# Patient Record
Sex: Female | Born: 1940 | Race: White | Hispanic: No | Marital: Married | Smoking: Former smoker
Health system: Southern US, Community
[De-identification: ages and names within clinical notes are randomized; demographics above are authoritative.]

## PROBLEM LIST (undated history)

## (undated) DIAGNOSIS — I10 Essential (primary) hypertension: Secondary | ICD-10-CM

## (undated) DIAGNOSIS — I350 Nonrheumatic aortic (valve) stenosis: Secondary | ICD-10-CM

## (undated) DIAGNOSIS — T8859XA Other complications of anesthesia, initial encounter: Secondary | ICD-10-CM

## (undated) DIAGNOSIS — J3089 Other allergic rhinitis: Secondary | ICD-10-CM

## (undated) DIAGNOSIS — H9190 Unspecified hearing loss, unspecified ear: Secondary | ICD-10-CM

## (undated) DIAGNOSIS — R112 Nausea with vomiting, unspecified: Secondary | ICD-10-CM

## (undated) DIAGNOSIS — Z9889 Other specified postprocedural states: Secondary | ICD-10-CM

## (undated) DIAGNOSIS — E7800 Pure hypercholesterolemia, unspecified: Secondary | ICD-10-CM

## (undated) DIAGNOSIS — K5900 Constipation, unspecified: Secondary | ICD-10-CM

## (undated) DIAGNOSIS — S92909A Unspecified fracture of unspecified foot, initial encounter for closed fracture: Secondary | ICD-10-CM

## (undated) DIAGNOSIS — G629 Polyneuropathy, unspecified: Secondary | ICD-10-CM

## (undated) DIAGNOSIS — K589 Irritable bowel syndrome without diarrhea: Secondary | ICD-10-CM

## (undated) DIAGNOSIS — T4145XA Adverse effect of unspecified anesthetic, initial encounter: Secondary | ICD-10-CM

## (undated) DIAGNOSIS — R011 Cardiac murmur, unspecified: Secondary | ICD-10-CM

## (undated) HISTORY — PX: TONSILLECTOMY: SUR1361

## (undated) HISTORY — PX: ABDOMINAL HYSTERECTOMY: SHX81

## (undated) HISTORY — PX: CHOLECYSTECTOMY: SHX55

---

## 2012-09-09 ENCOUNTER — Emergency Department: Payer: Self-pay | Admitting: Emergency Medicine

## 2012-09-09 LAB — COMPREHENSIVE METABOLIC PANEL
Albumin: 3.9 g/dL (ref 3.4–5.0)
Alkaline Phosphatase: 128 U/L (ref 50–136)
Anion Gap: 7 (ref 7–16)
Bilirubin,Total: 0.5 mg/dL (ref 0.2–1.0)
Calcium, Total: 9 mg/dL (ref 8.5–10.1)
Chloride: 104 mmol/L (ref 98–107)
Creatinine: 0.58 mg/dL — ABNORMAL LOW (ref 0.60–1.30)
EGFR (African American): >60
EGFR (Non-African Amer.): >60
Osmolality: 278 (ref 275–301)
Potassium: 4.6 mmol/L (ref 3.5–5.1)
SGOT(AST): 25 U/L (ref 15–37)
Sodium: 138 mmol/L (ref 136–145)

## 2012-09-09 LAB — URINALYSIS, COMPLETE
Bacteria: NONE SEEN
Blood: NEGATIVE
Glucose,UR: NEGATIVE mg/dL (ref 0–75)
Ketone: NEGATIVE
Nitrite: NEGATIVE
Ph: 8 (ref 4.5–8.0)
Protein: 100
Specific Gravity: 1.016 (ref 1.003–1.030)
Squamous Epithelial: 3
WBC UR: 12 /HPF (ref 0–5)

## 2012-09-09 LAB — CBC
HCT: 41.6 % (ref 35.0–47.0)
HGB: 14.6 g/dL (ref 12.0–16.0)
MCH: 32.2 pg (ref 26.0–34.0)
Platelet: 256 10*3/uL (ref 150–440)
RDW: 12.5 % (ref 11.5–14.5)

## 2012-09-09 LAB — LIPASE, BLOOD: Lipase: 137 U/L (ref 73–393)

## 2013-02-13 ENCOUNTER — Emergency Department: Payer: Self-pay | Admitting: Emergency Medicine

## 2013-02-13 LAB — CBC
HCT: 40.8 % (ref 35.0–47.0)
HGB: 14.1 g/dL (ref 12.0–16.0)
Platelet: 286 10*3/uL (ref 150–440)
RBC: 4.43 10*6/uL (ref 3.80–5.20)
RDW: 12.5 % (ref 11.5–14.5)

## 2013-02-14 LAB — URINALYSIS, COMPLETE
Bilirubin,UR: NEGATIVE
Blood: NEGATIVE
Ketone: NEGATIVE
Leukocyte Esterase: NEGATIVE
RBC,UR: NONE SEEN /HPF (ref 0–5)
Specific Gravity: 1.006 (ref 1.003–1.030)

## 2013-02-14 LAB — COMPREHENSIVE METABOLIC PANEL
Albumin: 3.9 g/dL (ref 3.4–5.0)
Alkaline Phosphatase: 131 U/L — ABNORMAL HIGH
Anion Gap: 7 (ref 7–16)
BUN: 15 mg/dL (ref 7–18)
Bilirubin,Total: 0.3 mg/dL (ref 0.2–1.0)
Chloride: 103 mmol/L (ref 98–107)
Creatinine: 0.81 mg/dL (ref 0.60–1.30)
Glucose: 110 mg/dL — ABNORMAL HIGH (ref 65–99)
Osmolality: 273 (ref 275–301)
Potassium: 3.7 mmol/L (ref 3.5–5.1)
SGOT(AST): 27 U/L (ref 15–37)
SGPT (ALT): 27 U/L (ref 12–78)
Sodium: 136 mmol/L (ref 136–145)
Total Protein: 7.5 g/dL (ref 6.4–8.2)

## 2013-02-14 LAB — TROPONIN I: Troponin-I: <0.02 ng/mL

## 2013-02-14 LAB — CK TOTAL AND CKMB (NOT AT ARMC): CK, Total: 65 U/L (ref 21–215)

## 2014-12-25 IMAGING — CR DG CHEST 2V
1 series · 2 of 2 positions shown · non-contrast
Comparison: None.

CLINICAL DATA: Elevated blood pressure.  Flushed feeling.

EXAM:
CHEST  2 VIEW

[Series 1: w chest pa · 0.14mm/px · 2 of 2 slices shown]
[im 1/2]
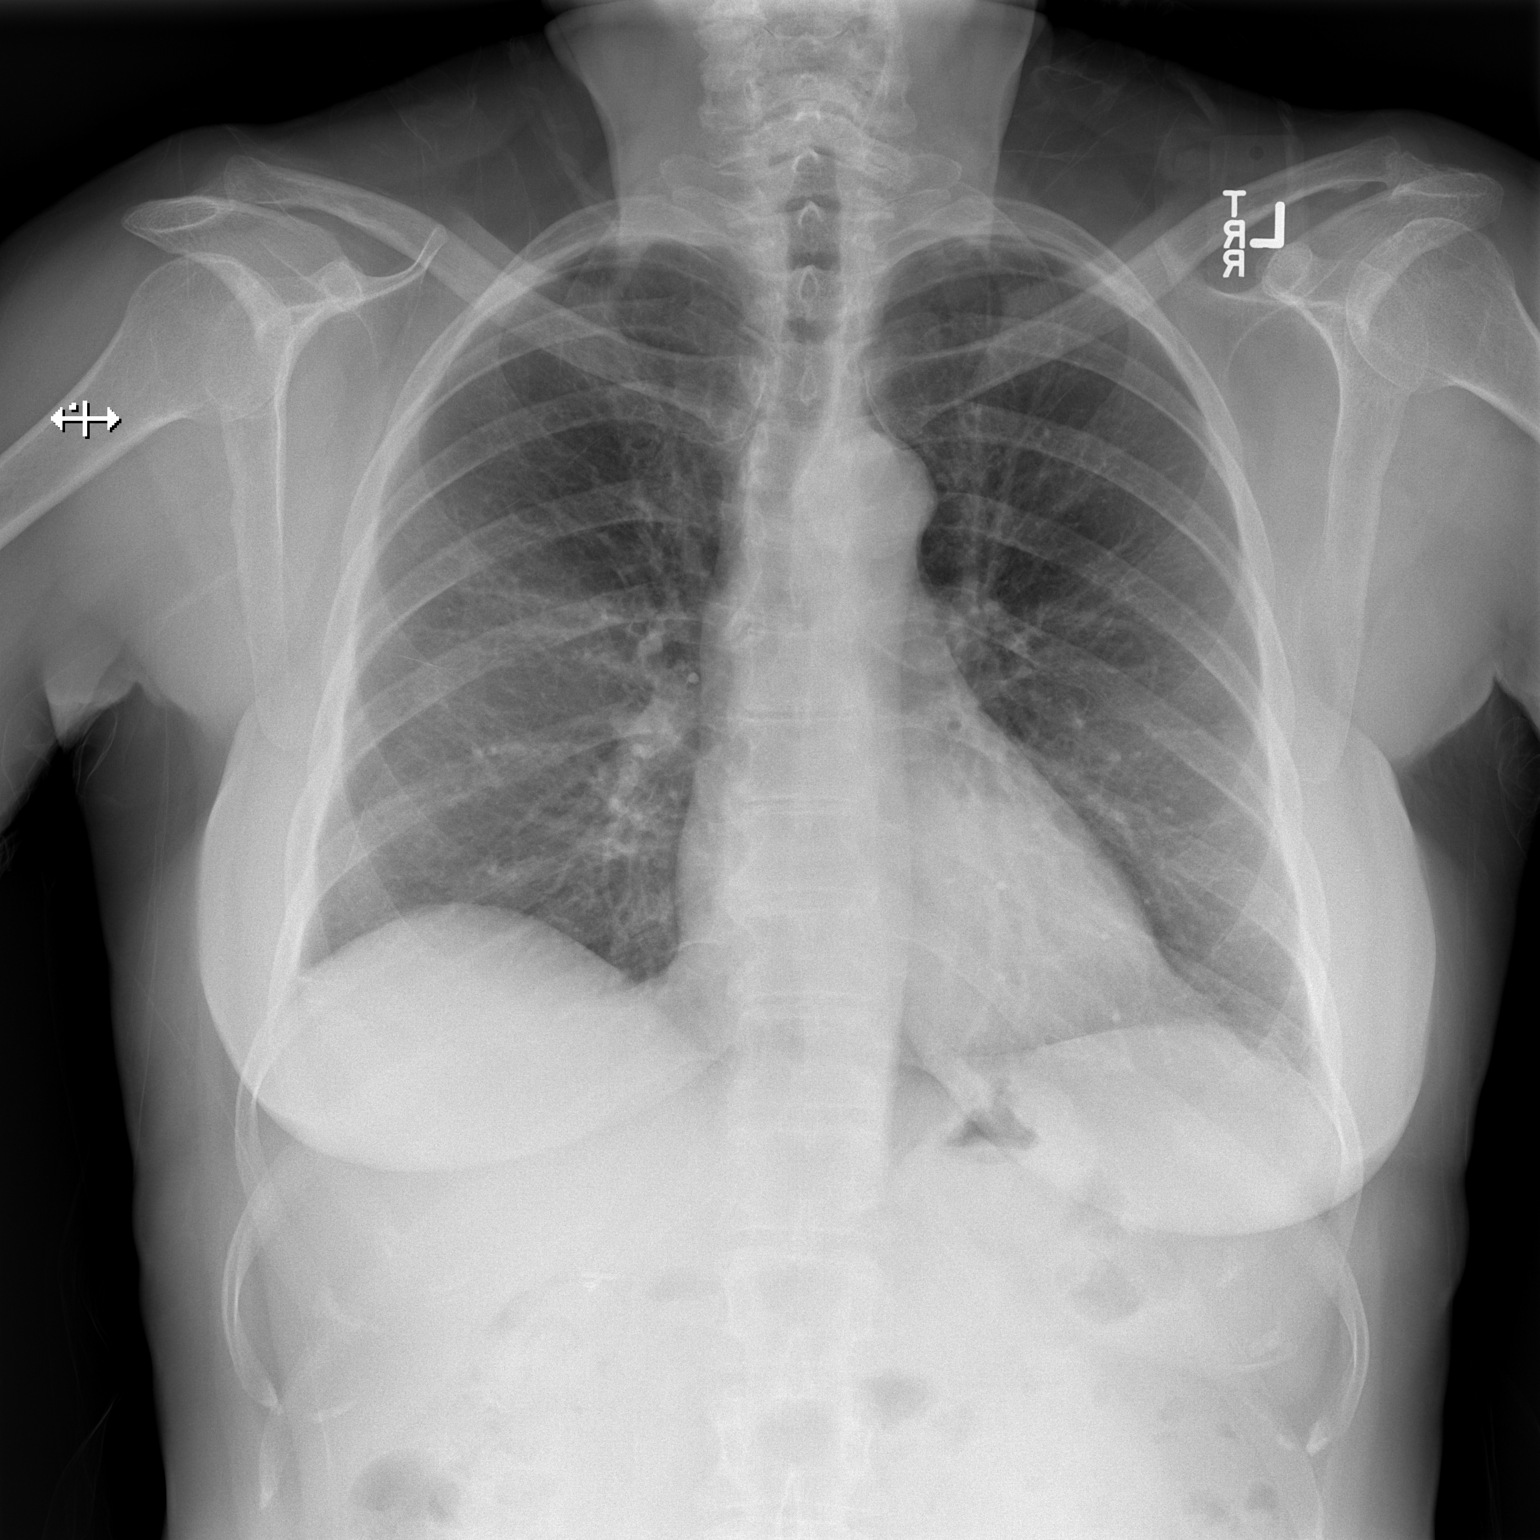
[im 2/2]
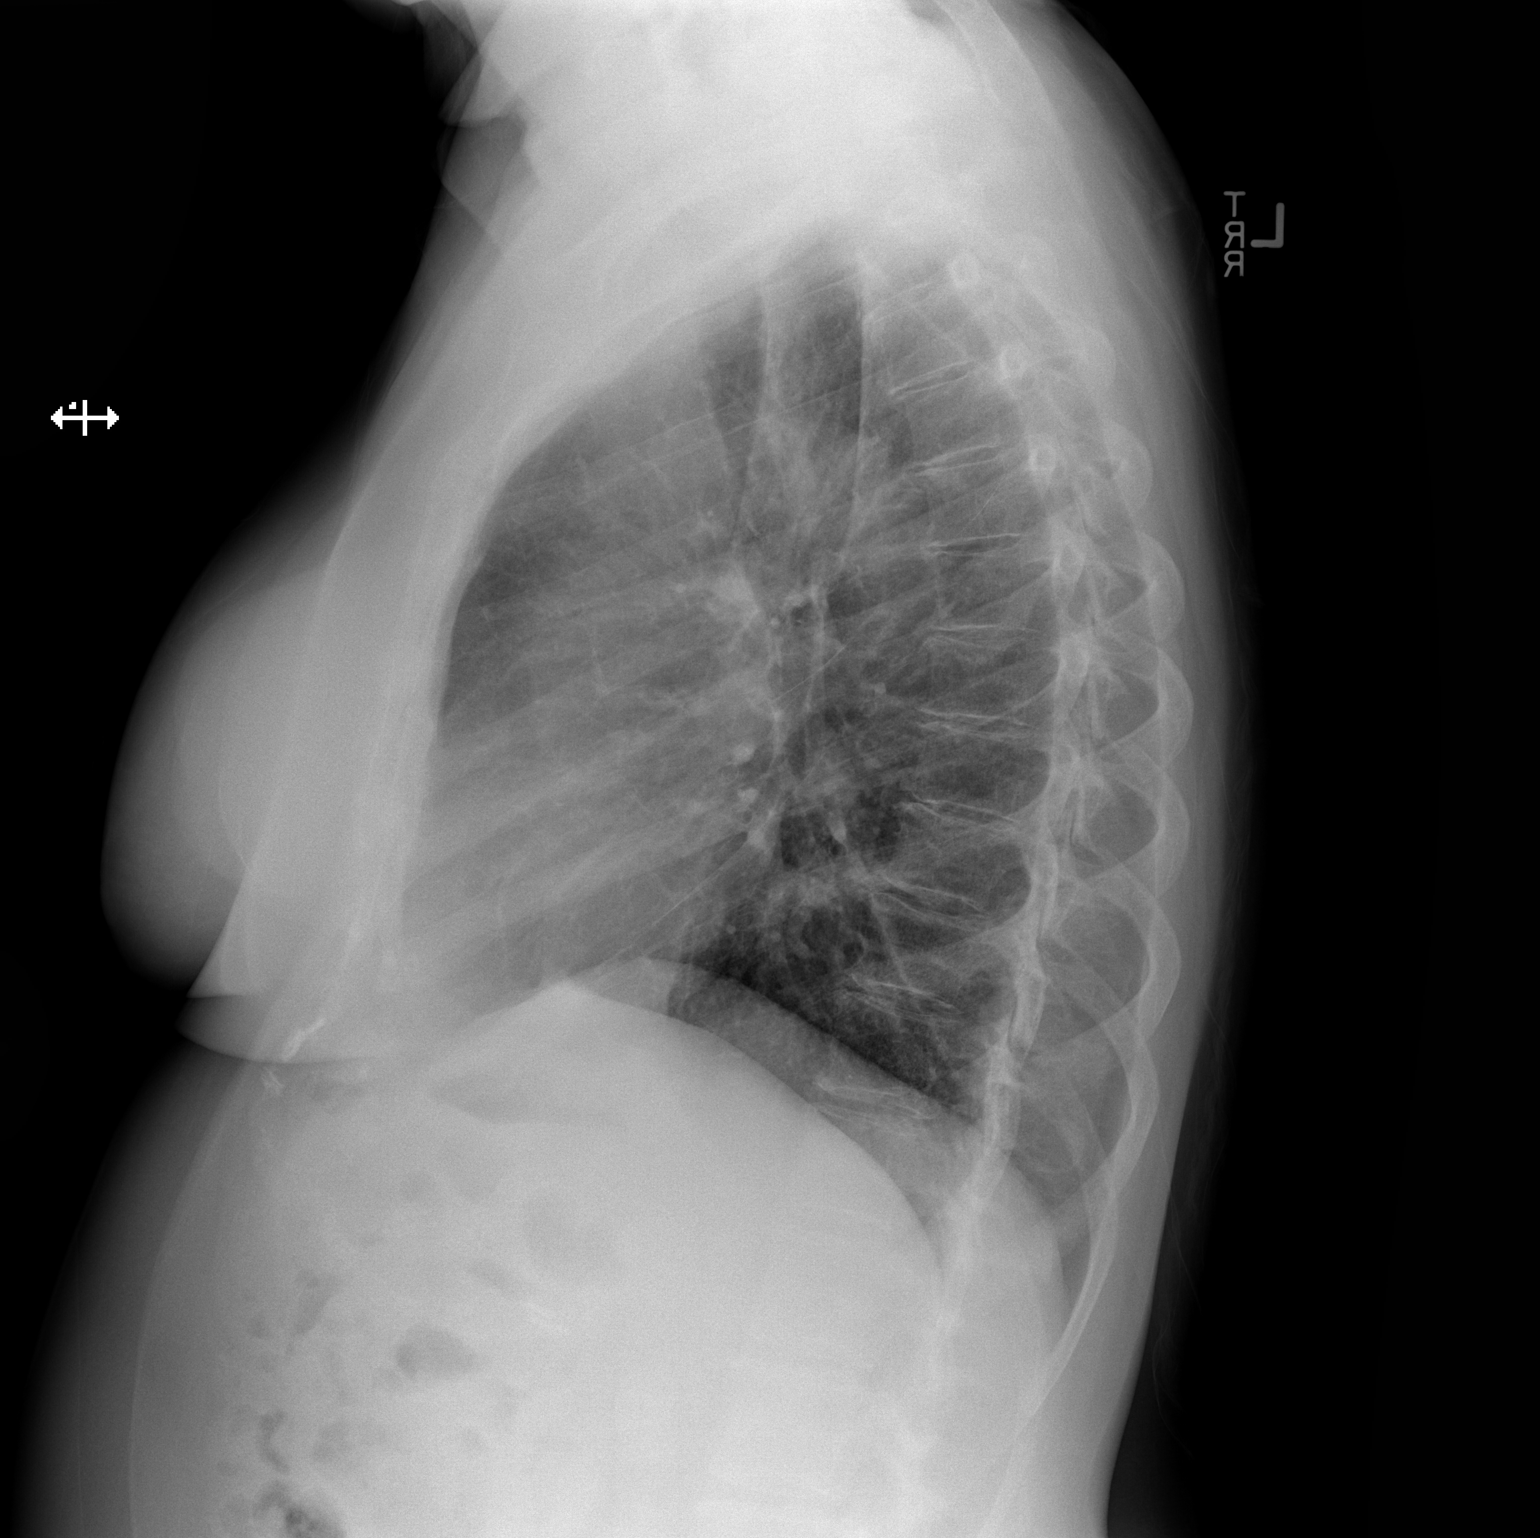

[2 of 2 positions shown; findings below may reference images not displayed]

FINDINGS: The heart size and mediastinal contours are within normal limits.
Both lungs are clear. The visualized skeletal structures are
unremarkable.
IMPRESSION: No active cardiopulmonary disease.

## 2014-12-25 IMAGING — CT CT HEAD WITHOUT CONTRAST
1 series · 16 of 30 positions shown, 20 images · non-contrast
Comparison: 09/09/2012

CLINICAL DATA: Hypertension and headaches.

EXAM:
CT HEAD WITHOUT CONTRAST
TECHNIQUE: Contiguous axial images were obtained from the base of the skull
through the vertex without intravenous contrast.

[Series 2: head wo · axial · 0.42mm/px · z∈[+390,+516]mm · 16 of 32 slices shown, 20 images]
[im 2/32  brain]
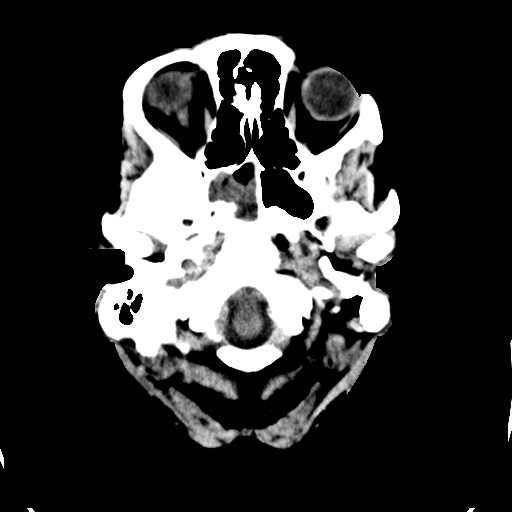
[im 2/32  bone]
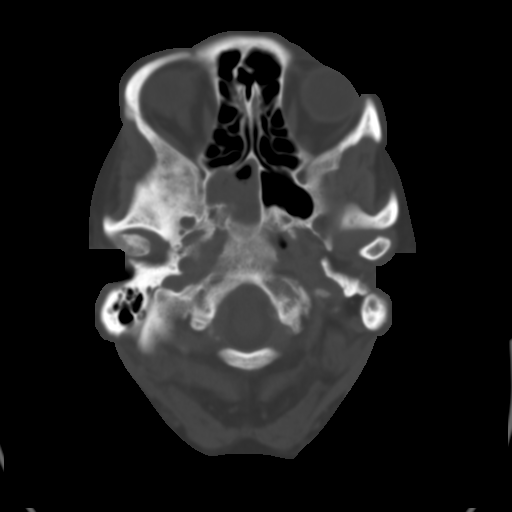
[im 4/32  brain]
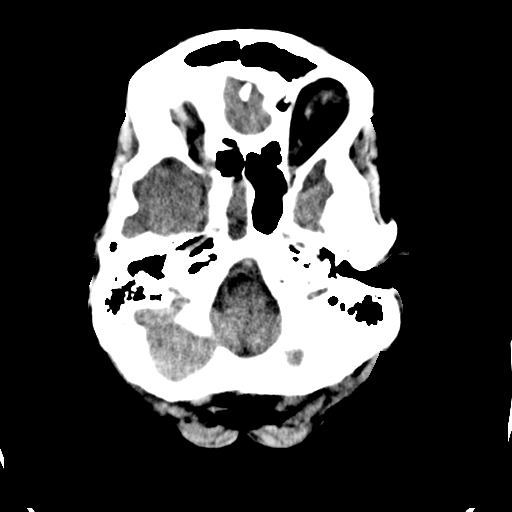
[im 6/32  brain]
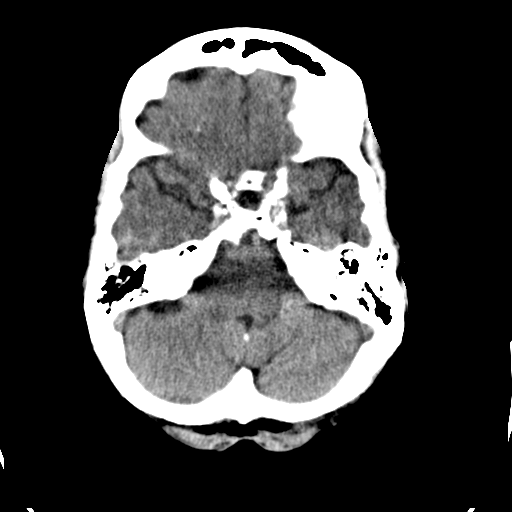
[im 8/32  brain]
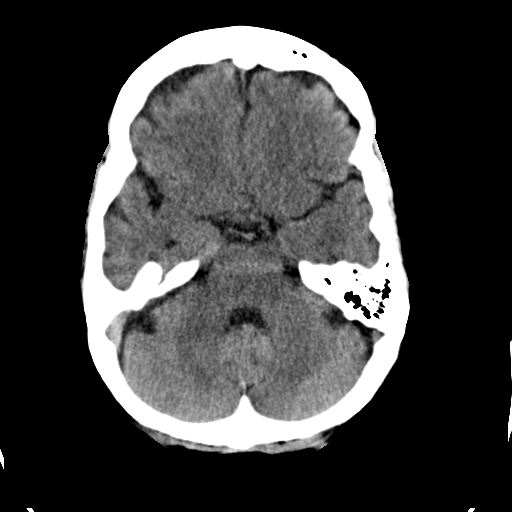
[im 9/32  brain]
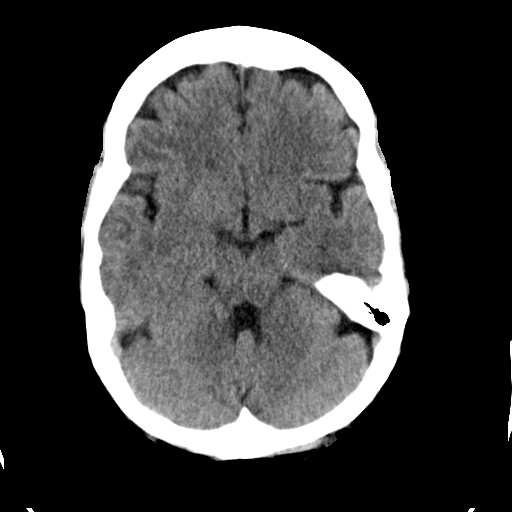
[im 9/32  bone]
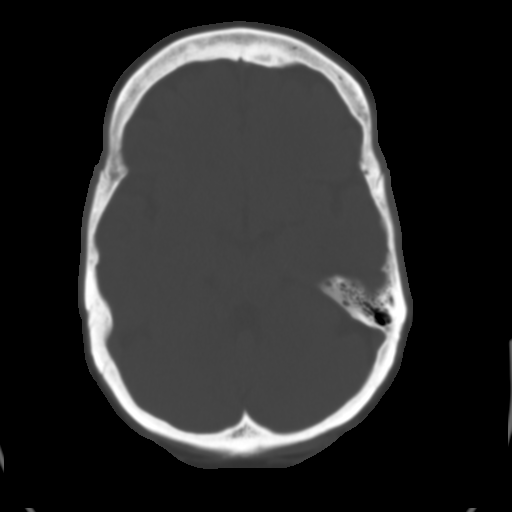
[im 11/32  brain]
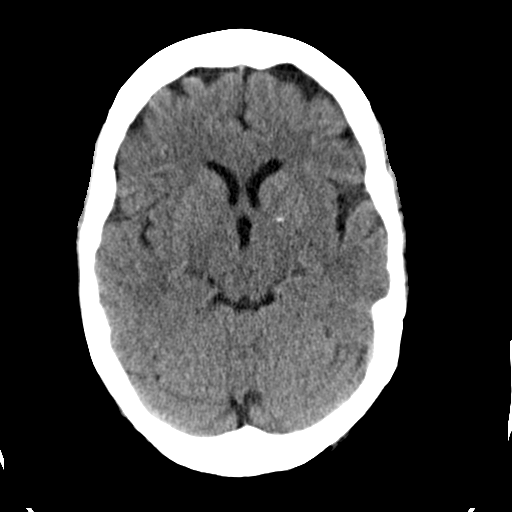
[im 13/32  brain]
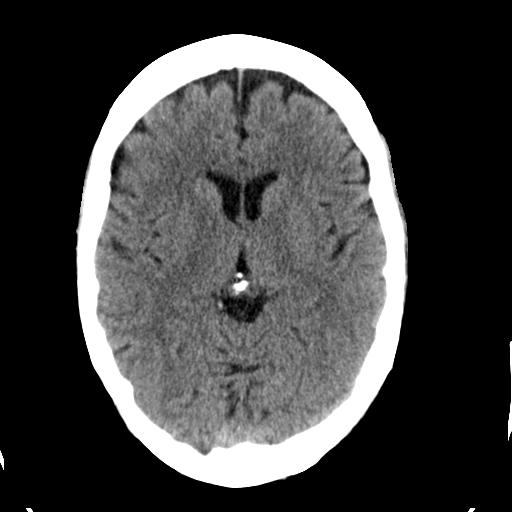
[im 15/32  brain]
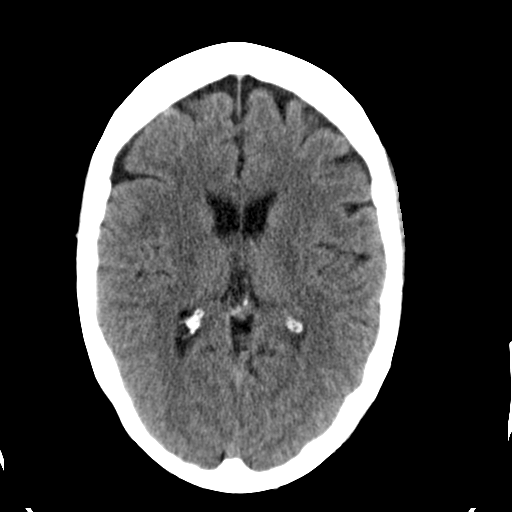
[im 17/32  brain]
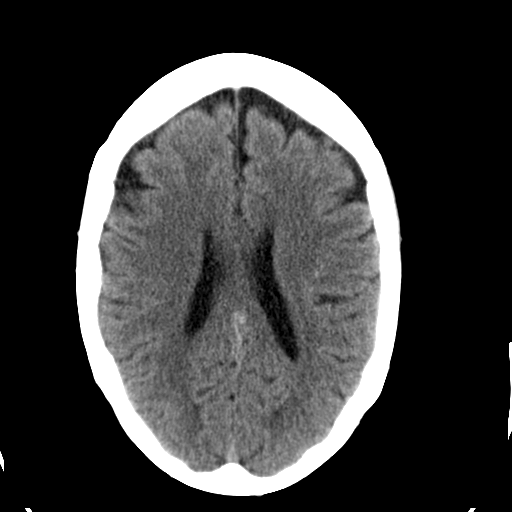
[im 17/32  bone]
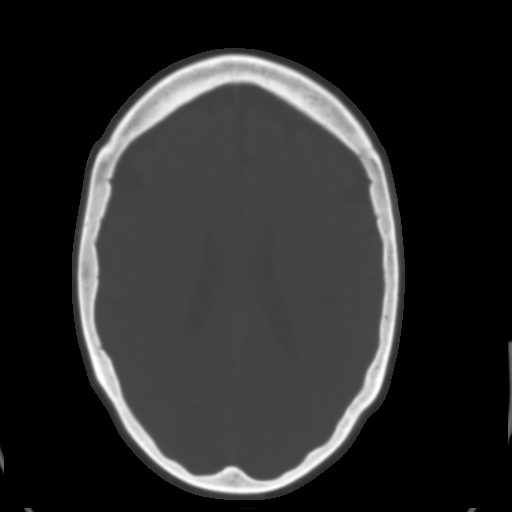
[im 19/32  brain]
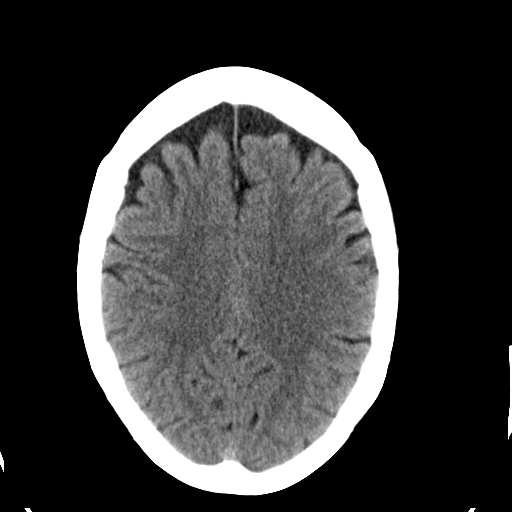
[im 21/32  brain]
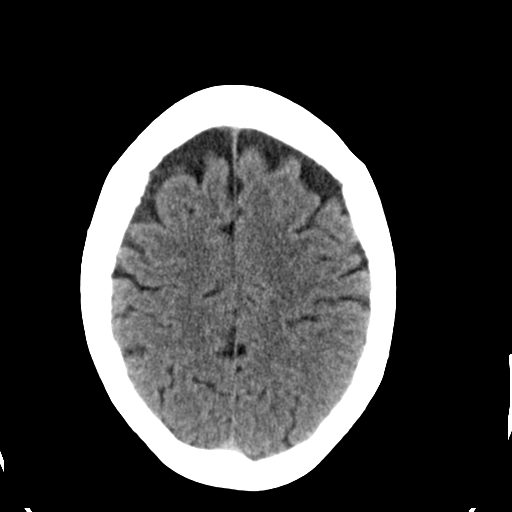
[im 23/32  brain]
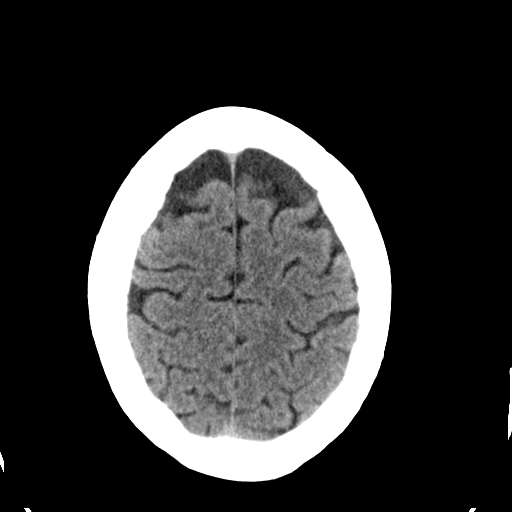
[im 24/32  brain]
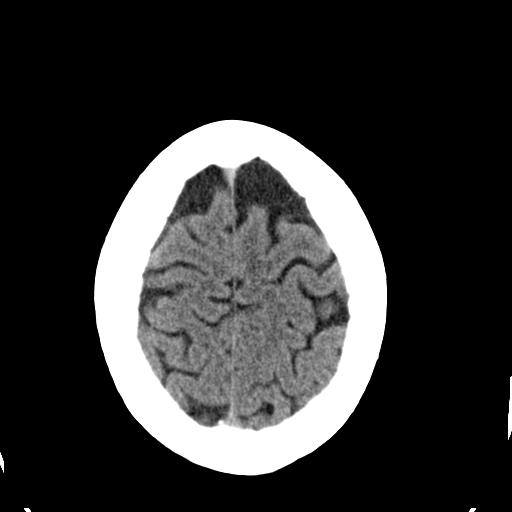
[im 24/32  bone]
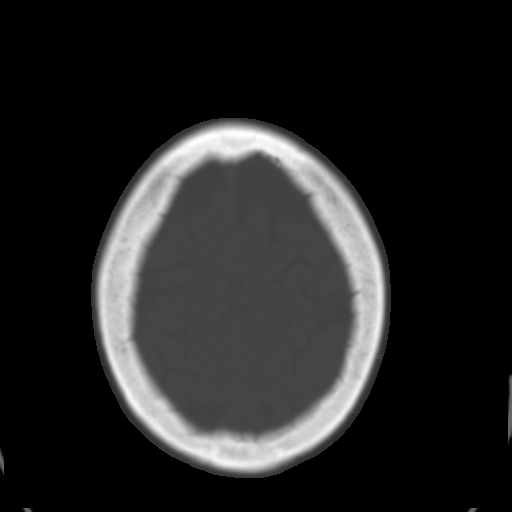
[im 26/32  brain]
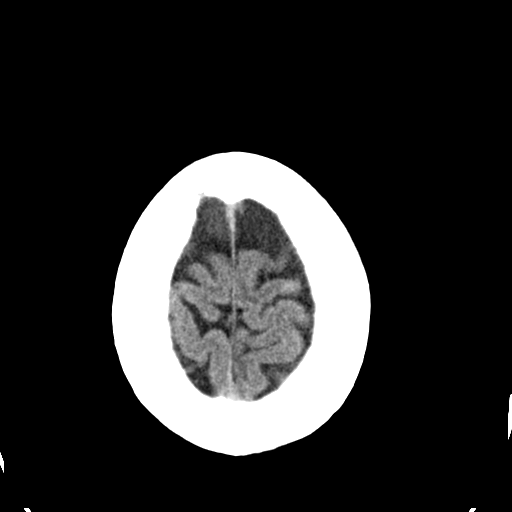
[im 28/32  brain]
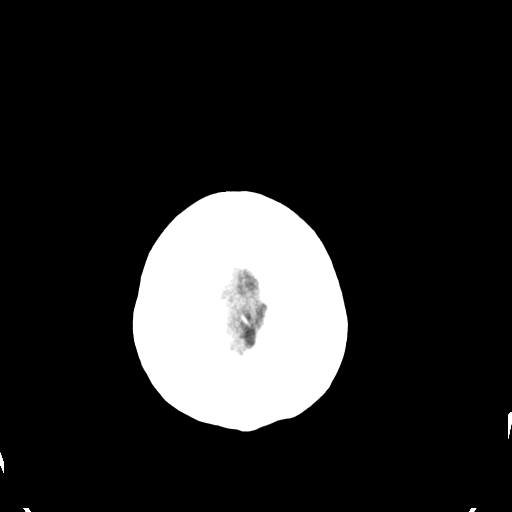
[im 30/32  brain]
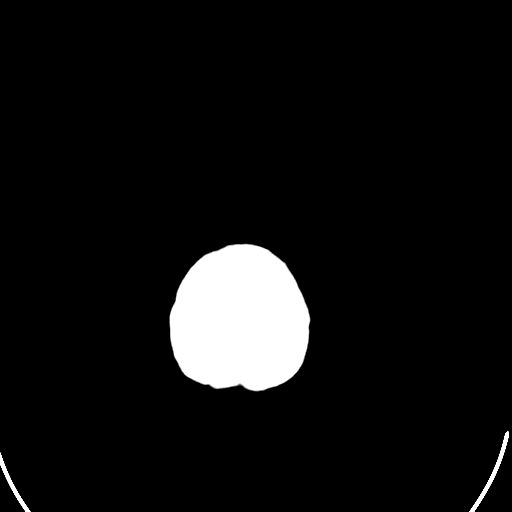

[16 of 30 positions shown; findings below may reference images not displayed]

FINDINGS: Mild diffuse cerebral atrophy. No ventricular dilatation.
Low-attenuation change in the deep white matter consistent with
small vessel ischemia. No mass effect or midline shift. No abnormal
extra-axial fluid collections. Gray-white matter junctions are
distinct. Basal cisterns are not effaced. No evidence of acute
intracranial hemorrhage. No depressed skull fractures. There is
opacification of the sphenoid sinuses. This is increasing since the
previous study. Vascular calcifications. Mastoid air cells and
additional paranasal sinuses are not opacified
IMPRESSION: No acute intracranial abnormalities. Mild chronic atrophy and small
vessel ischemic changes. Increasing opacity of the sphenoid sinus.

## 2017-08-07 ENCOUNTER — Ambulatory Visit
Admission: EM | Admit: 2017-08-07 | Discharge: 2017-08-07 | Disposition: A | Discharge status: Home / Self Care | Payer: Medicare Other | Attending: Family Medicine | Admitting: Family Medicine

## 2017-08-07 ENCOUNTER — Ambulatory Visit: Payer: Medicare Other

## 2017-08-07 ENCOUNTER — Other Ambulatory Visit: Payer: Self-pay

## 2017-08-07 DIAGNOSIS — I10 Essential (primary) hypertension: Secondary | ICD-10-CM | POA: Diagnosis not present

## 2017-08-07 DIAGNOSIS — Z7982 Long term (current) use of aspirin: Secondary | ICD-10-CM | POA: Insufficient documentation

## 2017-08-07 DIAGNOSIS — S8012XA Contusion of left lower leg, initial encounter: Secondary | ICD-10-CM | POA: Diagnosis not present

## 2017-08-07 DIAGNOSIS — S90812A Abrasion, left foot, initial encounter: Secondary | ICD-10-CM | POA: Diagnosis not present

## 2017-08-07 DIAGNOSIS — W109XXA Fall (on) (from) unspecified stairs and steps, initial encounter: Secondary | ICD-10-CM | POA: Insufficient documentation

## 2017-08-07 DIAGNOSIS — R030 Elevated blood-pressure reading, without diagnosis of hypertension: Secondary | ICD-10-CM | POA: Insufficient documentation

## 2017-08-07 DIAGNOSIS — M79605 Pain in left leg: Secondary | ICD-10-CM | POA: Diagnosis present

## 2017-08-07 DIAGNOSIS — S92215A Nondisplaced fracture of cuboid bone of left foot, initial encounter for closed fracture: Secondary | ICD-10-CM | POA: Insufficient documentation

## 2017-08-07 DIAGNOSIS — Z79899 Other long term (current) drug therapy: Secondary | ICD-10-CM | POA: Diagnosis not present

## 2017-08-07 DIAGNOSIS — Z87891 Personal history of nicotine dependence: Secondary | ICD-10-CM | POA: Insufficient documentation

## 2017-08-07 DIAGNOSIS — E7800 Pure hypercholesterolemia, unspecified: Secondary | ICD-10-CM | POA: Insufficient documentation

## 2017-08-07 DIAGNOSIS — Z23 Encounter for immunization: Secondary | ICD-10-CM | POA: Diagnosis not present

## 2017-08-07 DIAGNOSIS — W108XXA Fall (on) (from) other stairs and steps, initial encounter: Secondary | ICD-10-CM | POA: Diagnosis not present

## 2017-08-07 DIAGNOSIS — Z7951 Long term (current) use of inhaled steroids: Secondary | ICD-10-CM | POA: Diagnosis not present

## 2017-08-07 HISTORY — DX: Constipation, unspecified: K59.00

## 2017-08-07 HISTORY — DX: Pure hypercholesterolemia, unspecified: E78.00

## 2017-08-07 HISTORY — DX: Other allergic rhinitis: J30.89

## 2017-08-07 HISTORY — DX: Essential (primary) hypertension: I10

## 2017-08-07 MED ORDER — TETANUS-DIPHTH-ACELL PERTUSSIS 5-2.5-18.5 LF-MCG/0.5 IM SUSP
0.5000 mL | Freq: Once | INTRAMUSCULAR | Status: AC
  Administered 2017-08-07: 0.5 mL via INTRAMUSCULAR

## 2017-08-07 NOTE — ED Notes (Signed)
Ice to left foot

## 2017-08-07 NOTE — Discharge Instructions (Signed)
Ice. Elevate. Keep in splint and use walker. Rest. Drink plenty of fluids.   Follow-up with podiatry this week as discussed, for the cuboid fracture.  Follow up with your primary care physician this week as needed. Return to Urgent care for new or worsening concerns.

## 2017-08-07 NOTE — ED Notes (Signed)
Pt lying on stretcher resting quietly, NAD noted. Pt awaiting xray results and updated on POC.

## 2017-08-07 NOTE — ED Provider Notes (Signed)
[facility] ____________________________________________  Time seen: Approximately 8:39 AM  I have reviewed the triage vital signs and the nursing notes.   HISTORY  Chief Complaint Foot Injury  HPI Casey Michael is a 77 y.o. female presented for evaluation of left foot pain after a fall that occurred just prior to arrival.  Patient reports that she was going outside to walk her dog, but the dog pulled causing her to fall down the front for concrete steps.  States unsure how her foot landed but thinks that it went behind her on the concrete step.  States that she tried to catch herself.  Does report there is a small cut to the back of her left heel.  Reports that she may have hit her head, denies any loss of consciousness.  Pain to left foot and painful to weight-bear.  No over-the-counter medications taken prior to arrival.  Did take her normal daily medications.  Denies headache, vision changes, paresthesias, unilateral weakness, nausea, vomiting, neck or back pain.  Reports otherwise feels well.  Denies other complaints at this time. Denies chest pain, shortness of breath, abdominal pain, or rash. Denies recent sickness. Denies recent antibiotic use.  Unsure of last tetanus immunization.    Past Medical History:  Diagnosis Date  . Constipation   . Environmental and seasonal allergies   . High cholesterol   . Hypertension     There are no active problems to display for this patient.   Past Surgical History:  Procedure Laterality Date  . ABDOMINAL HYSTERECTOMY    . CHOLECYSTECTOMY       No current facility-administered medications for this encounter.   Current Outpatient Medications:  .  aspirin EC 81 MG tablet, Take 81 mg by mouth daily., Disp: , Rfl:  .  cloNIDine (CATAPRES) 0.1 MG tablet, Take by mouth., Disp: , Rfl:  .  desonide (DESOWEN) 0.05 % cream, Apply topically., Disp: , Rfl:  .  fluticasone (FLONASE) 50 MCG/ACT nasal spray, once daily as needed for  Rhinitis., Disp: , Rfl:  .  gabapentin (NEURONTIN) 300 MG capsule, One po nightly and increase to bid to tid as needed for nerve pain, Disp: , Rfl:  .  losartan (COZAAR) 100 MG tablet, Take by mouth., Disp: , Rfl:  .  lubiprostone (AMITIZA) 8 MCG capsule, Take by mouth., Disp: , Rfl:  .  rosuvastatin (CRESTOR) 40 MG tablet, Take by mouth., Disp: , Rfl:  .  traMADol (ULTRAM) 50 MG tablet, Take by mouth., Disp: , Rfl:  .  carvedilol (COREG) 6.25 MG tablet, , Disp: , Rfl: 3 .  loratadine (CLARITIN) 10 MG tablet, Take by mouth., Disp: , Rfl:  .  metroNIDAZOLE (METROCREAM) 0.75 % cream, Apply topically., Disp: , Rfl:  .  mometasone (ELOCON) 0.1 % cream, Reported on 04/01/2015, Disp: , Rfl:  .  Multiple Vitamins-Minerals (CENTRUM) tablet, Take by mouth., Disp: , Rfl:   Allergies Niacin; Penicillins; Atorvastatin; Metronidazole; Simvastatin; and Colesevelam  History reviewed. No pertinent family history.  Social History Social History   Tobacco Use  . Smoking status: Former Games developer  . Smokeless tobacco: Never Used  Substance Use Topics  . Alcohol use: Not Currently  . Drug use: Never    Review of Systems Constitutional: No fever/chills Eyes: No visual changes. Cardiovascular: Denies chest pain. Respiratory: Denies shortness of breath. Gastrointestinal: No abdominal pain.   Musculoskeletal: Negative for back pain. AS above.  Skin: Negative for rash. Neurological: Negative for headaches, focal weakness or numbness.  ____________________________________________   PHYSICAL EXAM:  VITAL SIGNS: ED Triage Vitals [08/07/17 0822]  Enc Vitals Group     BP (!) 197/80     Pulse Rate 66     Resp 18     Temp 98 F (36.7 C)     Temp Source Oral     SpO2 100 %     Weight 165 lb (74.8 kg)     Height  (1.676 m)     Head Circumference      Peak Flow      Pain Score 4     Pain Loc      Pain Edu?      Excl. in GC?    Vitals:   08/07/17 0822 08/07/17 0906 08/07/17 0932  BP:  (!) 197/80 (!) 184/75 (!) 158/84  Pulse: 66    Resp: 18    Temp: 98 F (36.7 C)    TempSrc: Oral    SpO2: 100%    Weight: 165 lb (74.8 kg)    Height:  (1.676 m)       Constitutional: Alert and oriented. Well appearing and in no acute distress. Eyes: Conjunctivae are normal. PERRL.  ENT      Head: Normocephalic and atraumatic. Cardiovascular: Normal rate, regular rhythm. Grossly normal heart sounds.  Good peripheral circulation. Respiratory: Normal respiratory effort without tachypnea nor retractions. Breath sounds are clear and equal bilaterally. No wheezes, rales, rhonchi. Musculoskeletal: No midline cervical, thoracic or lumbar tenderness to palpation. Bilateral pedal pulses equal and easily palpated. Except: Left mid to lateral foot mild to moderate tenderness to palpation, particularly at base of fourth and fifth metatarsal along the cuboid with mild ecchymosis and mild edema, left foot otherwise nontender.  Left mid tibial area mild tenderness to direct palpation, no ecchymosis, no edema, left lower extremity otherwise nontender.  Left foot full range of motion present but with pain to dorsiflexion.  Gait not tested due to pain.  Left foot posterior heel superficial abrasion.  Neurologic:  Normal speech and language. No gross focal neurologic deficits are appreciated. Speech is normal.  Skin:  Skin is warm, dry.  Psychiatric: Mood and affect are normal. Speech and behavior are normal. Patient exhibits appropriate insight and judgment   ___________________________________________   LABS (all labs ordered are listed, but only abnormal results are displayed)  Labs Reviewed - No data to display  RADIOLOGY  Dg Tibia/fibula Left  Result Date: 08/07/2017 CLINICAL DATA:  Fall down steps, mid tibial tenderness, initial encounter. EXAM: LEFT TIBIA AND FIBULA - 2 VIEW COMPARISON:  None. FINDINGS: No acute osseous abnormality. IMPRESSION: No acute osseous abnormality. Electronically  Signed   By: Leanna Battles M.D.   On: 08/07/2017 09:29   Dg Foot Complete Left  Result Date: 08/07/2017 CLINICAL DATA:  Fall down steps, dorsal pain, initial encounter. EXAM: LEFT FOOT - COMPLETE 3+ VIEW COMPARISON:  None. FINDINGS: A subtle lucency is seen along the anterior aspect of the lateral cuboid, on the oblique view only. There is an associated curvilinear density along the posterior aspect of the lateral cuboid. No additional evidence of an acute fracture. Calcaneal spur. IMPRESSION: Possible nondisplaced fracture of the anterolateral corner of the cuboid. No additional evidence of an acute injury. Electronically Signed   By: Leanna Battles M.D.   On: 08/07/2017 09:28   ____________________________________________   PROCEDURES Procedures   INITIAL IMPRESSION / ASSESSMENT AND PLAN / ED COURSE  Pertinent labs & imaging results that were available during  my care of the patient were reviewed by me and considered in my medical decision making (see chart for details).  Well-appearing patient.  No acute distress.  Presented for evaluation of left foot pain post mechanical injury tetanus immunization updated.  Will evaluate left foot and left tib-fib x-rays.  Patient with history of hypertension, did take her medications prior to arrival, with resting room blood pressure now 158/84.  Much improved.  X-ray results as above per radiologist and reviewed by myself.  Left foot x-ray possible nondisplaced fracture of the anterior lateral corner of the cuboid, patient is point tender in same area.  Suspect acute fracture.  Posterior OCL splint applied by nurse.  Walker given.  Encourage nonweightbearing.  Patient denies need for pain medication.  Instructed ice and elevation.  Follow-up with podiatry this coming week.  Information given.  Also continue to monitor blood pressure and take medications as prescribed.  Discussed follow up with Primary care physician this week. Discussed follow up and  return parameters including no resolution or any worsening concerns. Patient verbalized understanding and agreed to plan.   ____________________________________________   FINAL CLINICAL IMPRESSION(S) / ED DIAGNOSES  Final diagnoses:  Closed nondisplaced fracture of cuboid of left foot, initial encounter  Abrasion of left foot, initial encounter  Contusion of left lower extremity, initial encounter  Elevated blood pressure reading     ED Discharge Orders    None       Note: This dictation was prepared with Dragon dictation along with smaller phrase technology. Any transcriptional errors that result from this process are unintentional.         Renford Dills, NP 08/07/17 1034

## 2017-08-07 NOTE — ED Notes (Signed)
Posterior ankle splint to left leg. PMS intact post application

## 2017-08-07 NOTE — ED Triage Notes (Signed)
Pt reports her dog pulled her down some steps this morning. Left foot pain to lateral area 4/10. Mild discoloration and mild swelling noted.

## 2017-08-27 ENCOUNTER — Ambulatory Visit
Admission: EM | Admit: 2017-08-27 | Discharge: 2017-08-27 | Disposition: A | Discharge status: Home / Self Care | Payer: Medicare Other | Attending: Family Medicine | Admitting: Family Medicine

## 2017-08-27 ENCOUNTER — Other Ambulatory Visit: Payer: Self-pay

## 2017-08-27 ENCOUNTER — Encounter: Payer: Self-pay | Admitting: Emergency Medicine

## 2017-08-27 DIAGNOSIS — J029 Acute pharyngitis, unspecified: Secondary | ICD-10-CM | POA: Diagnosis not present

## 2017-08-27 LAB — RAPID STREP SCREEN (MED CTR MEBANE ONLY): Streptococcus, Group A Screen (Direct): NEGATIVE

## 2017-08-27 MED ORDER — LIDOCAINE VISCOUS HCL 2 % MT SOLN: 15.0000 mL | OROMUCOSAL | 0 refills | Status: DC | PRN

## 2017-08-27 NOTE — Discharge Instructions (Addendum)
Rest, fluids.  Tylenol 1000 mg three times daily as needed.  Use the lidocaine.  Take care  Dr. Adriana Simasook

## 2017-08-27 NOTE — ED Provider Notes (Signed)
[facility]    CSN: [id number] Arrival date & time: 08/27/17  0802  History   Chief Complaint Chief Complaint  Patient presents with  . Sore Throat   HPI  77 year old female presents with sore throat.  3-day history of sore throat.  No fever.  No other upper respiratory symptoms.  Sore throat is severe.  Difficulty swallowing.  She is tried over-the-counter medications and home remedies without resolution.  She has used [employer] Spray with brief improvement.  Worse with swallowing.  No other associated symptoms.  No other complaints or concerns at this time.  Past Medical History:  Diagnosis Date  . Constipation   . Environmental and seasonal allergies   . High cholesterol   . Hypertension    Past Surgical History:  Procedure Laterality Date  . ABDOMINAL HYSTERECTOMY    . CHOLECYSTECTOMY     OB History   None    Home Medications    Prior to Admission medications   Medication Sig Start Date End Date Taking? Authorizing Provider  aspirin EC 81 MG tablet Take 81 mg by mouth daily.   Yes [provider]  carvedilol (COREG) 6.25 MG tablet  07/22/17  Yes [provider]  fluticasone (FLONASE) 50 MCG/ACT nasal spray once daily as needed for Rhinitis. 11/09/13  Yes [provider]  gabapentin (NEURONTIN) 300 MG capsule One po nightly and increase to bid to tid as needed for nerve pain 05/14/16  Yes [provider]  losartan (COZAAR) 100 MG tablet Take by mouth. 01/08/17  Yes [provider]  lubiprostone (AMITIZA) 8 MCG capsule Take by mouth. 02/18/17  Yes [provider]  Multiple Vitamins-Minerals (CENTRUM) tablet Take by mouth.   Yes [provider]  rosuvastatin (CRESTOR) 40 MG tablet Take by mouth. 03/07/15  Yes [provider]  traMADol (ULTRAM) 50 MG tablet Take by mouth. 04/10/17  Yes [provider]  lidocaine (XYLOCAINE) 2 % solution Use as directed 15 mLs in the mouth or throat  every 4 (four) hours as needed for mouth pain. 08/27/17   Tommie Samsook, Burnice Vassel G, DO    Family History Family History  Problem Relation Age of Onset  . Alzheimer's disease Mother   . Hypertension Mother   . Hyperlipidemia Mother   . Dementia Father   . Stroke Father     Social History Social History   Tobacco Use  . Smoking status: Former Smoker    Last attempt to quit: 08/28/1991    Years since quitting: 26.0  . Smokeless tobacco: Never Used  Substance Use Topics  . Alcohol use: Not Currently  . Drug use: Never     Allergies   Niacin; Penicillins; Atorvastatin; Metronidazole; Simvastatin; and Colesevelam   Review of Systems Review of Systems  Constitutional: Negative for fever.  HENT: Positive for sore throat.   Respiratory: Negative.    Physical Exam Triage Vital Signs ED Triage Vitals  Enc Vitals Group     BP 08/27/17 0813 (!) 169/65     Pulse Rate 08/27/17 0813 63     Resp 08/27/17 0813 16     Temp 08/27/17 0813 97.8 F (36.6 C)     Temp Source 08/27/17 0813 Oral     SpO2 08/27/17 0813 99 %     Weight 08/27/17 0814 165 lb (74.8 kg)     Height 08/27/17 0814 5\' 5"  (1.651 m)     Head Circumference --      Peak Flow --  Pain Score 08/27/17 0813 8     Pain Loc --      Pain Edu? --      Excl. in GC? --    Updated Vital Signs BP (!) 169/65 (BP Location: Right Arm)   Pulse 63   Temp 97.8 F (36.6 C) (Oral)   Resp 16   Ht 5\' 5"  (1.651 m)   Wt 165 lb (74.8 kg)   SpO2 99%   BMI 27.46 kg/m   Physical Exam  Constitutional: She is oriented to person, place, and time. She appears well-developed. No distress.  HENT:  Head: Normocephalic and atraumatic.  Oropharynx with mild edema and erythema.  Cardiovascular: Normal rate and regular rhythm.  Pulmonary/Chest: Effort normal and breath sounds normal. She has no wheezes. She has no rales.  Neurological: She is alert and oriented to person, place, and time.  Psychiatric: She has a normal mood and affect. Her  behavior is normal.  Nursing note and vitals reviewed.  UC Treatments / Results  Labs (all labs ordered are listed, but only abnormal results are displayed) Labs Reviewed  RAPID STREP SCREEN ([facility] ONLY)  CULTURE, GROUP A STREP [facility])   EKG None  Radiology No results found.  Procedures Procedures (including critical care time)  Medications Ordered in UC Medications - No data to display  Initial Impression / Assessment and Plan / UC Course  I have reviewed the triage vital signs and the nursing notes.  Pertinent labs & imaging results that were available during my care of the patient were reviewed by me and considered in my medical decision making (see chart for details).    77 year old female presents with sore throat.  Strep negative.  Likely viral.  Rest, fluids, Tylenol.  Viscous lidocaine as needed.  Final Clinical Impressions(s) / UC Diagnoses   Final diagnoses:  Pharyngitis, unspecified etiology     Discharge Instructions     Rest, fluids.  Tylenol 1000 mg three times daily as needed.  Use the lidocaine.  Take care  Dr. Adriana Simas     ED Prescriptions    Medication Sig Dispense Auth. Provider   lidocaine (XYLOCAINE) 2 % solution Use as directed 15 mLs in the mouth or throat every 4 (four) hours as needed for mouth pain. 200 mL Tommie Sams, DO     Controlled Substance Prescriptions [place] Controlled Substance Registry consulted? Not Applicable   Tommie Sams, [place] 08/27/17 [phone]

## 2017-08-27 NOTE — ED Triage Notes (Signed)
Patient in today c/o sore throat x 3 days. Patient denies fever. Patient states it feels like razor blades when she swallows. Patient denies cough, congestion, but does state she has drainage in her throat.

## 2017-08-29 LAB — CULTURE, GROUP A STREP (THRC)

## 2018-02-05 ENCOUNTER — Encounter: Payer: Self-pay | Admitting: *Deleted

## 2018-02-18 ENCOUNTER — Ambulatory Visit: Admission: RE | Admit: 2018-02-18 | Payer: Medicare Other | Source: Ambulatory Visit | Admitting: Ophthalmology

## 2018-02-18 ENCOUNTER — Encounter: Admission: RE | Payer: Self-pay | Source: Ambulatory Visit

## 2018-02-18 HISTORY — DX: Nausea with vomiting, unspecified: Z98.890

## 2018-02-18 HISTORY — DX: Cardiac murmur, unspecified: R01.1

## 2018-02-18 HISTORY — DX: Unspecified fracture of unspecified foot, initial encounter for closed fracture: S92.909A

## 2018-02-18 HISTORY — DX: Adverse effect of unspecified anesthetic, initial encounter: T41.45XA

## 2018-02-18 HISTORY — DX: Nonrheumatic aortic (valve) stenosis: I35.0

## 2018-02-18 HISTORY — DX: Unspecified hearing loss, unspecified ear: H91.90

## 2018-02-18 HISTORY — DX: Nausea with vomiting, unspecified: R11.2

## 2018-02-18 HISTORY — DX: Other complications of anesthesia, initial encounter: T88.59XA

## 2018-02-18 SURGERY — PHACOEMULSIFICATION, CATARACT, WITH IOL INSERTION
Anesthesia: Choice | Laterality: Left

## 2018-04-25 NOTE — Pre-Procedure Instructions (Signed)
Faxed request for new H&P to Dr. Gerome Sam office.

## 2018-04-29 ENCOUNTER — Encounter: Payer: Self-pay | Admitting: *Deleted

## 2018-05-13 ENCOUNTER — Ambulatory Visit
Admission: RE | Admit: 2018-05-13 | Discharge: 2018-05-13 | Disposition: A | Discharge status: Home / Self Care | Payer: Medicare Other | Attending: Ophthalmology | Admitting: Ophthalmology

## 2018-05-13 ENCOUNTER — Ambulatory Visit: Payer: Medicare Other | Admitting: Anesthesiology

## 2018-05-13 ENCOUNTER — Encounter
Admission: RE | Disposition: A | Discharge status: Home / Self Care | Payer: Self-pay | Source: Home / Self Care | Attending: Ophthalmology

## 2018-05-13 ENCOUNTER — Other Ambulatory Visit: Payer: Self-pay

## 2018-05-13 ENCOUNTER — Encounter: Payer: Self-pay | Admitting: Anesthesiology

## 2018-05-13 DIAGNOSIS — Z87891 Personal history of nicotine dependence: Secondary | ICD-10-CM | POA: Diagnosis not present

## 2018-05-13 DIAGNOSIS — I10 Essential (primary) hypertension: Secondary | ICD-10-CM | POA: Diagnosis not present

## 2018-05-13 DIAGNOSIS — H2512 Age-related nuclear cataract, left eye: Secondary | ICD-10-CM | POA: Insufficient documentation

## 2018-05-13 DIAGNOSIS — Z7982 Long term (current) use of aspirin: Secondary | ICD-10-CM | POA: Diagnosis not present

## 2018-05-13 DIAGNOSIS — G629 Polyneuropathy, unspecified: Secondary | ICD-10-CM | POA: Diagnosis not present

## 2018-05-13 DIAGNOSIS — E7800 Pure hypercholesterolemia, unspecified: Secondary | ICD-10-CM | POA: Insufficient documentation

## 2018-05-13 DIAGNOSIS — Z79899 Other long term (current) drug therapy: Secondary | ICD-10-CM | POA: Diagnosis not present

## 2018-05-13 HISTORY — PX: CATARACT EXTRACTION W/PHACO: SHX586

## 2018-05-13 HISTORY — DX: Irritable bowel syndrome, unspecified: K58.9

## 2018-05-13 HISTORY — DX: Polyneuropathy, unspecified: G62.9

## 2018-05-13 SURGERY — PHACOEMULSIFICATION, CATARACT, WITH IOL INSERTION
Anesthesia: Monitor Anesthesia Care | Site: Eye | Laterality: Left

## 2018-05-13 MED ORDER — LIDOCAINE HCL (PF) 4 % IJ SOLN
INTRAMUSCULAR | Status: AC
  Filled 2018-05-13: qty 5

## 2018-05-13 MED ORDER — NA CHONDROIT SULF-NA HYALURON 40-17 MG/ML IO SOLN
INTRAOCULAR | Status: AC
  Filled 2018-05-13: qty 1

## 2018-05-13 MED ORDER — EPINEPHRINE PF 1 MG/ML IJ SOLN
INTRAOCULAR | Status: DC | PRN
  Administered 2018-05-13: 1 mL via OPHTHALMIC

## 2018-05-13 MED ORDER — MIDAZOLAM HCL 2 MG/2ML IJ SOLN
INTRAMUSCULAR | Status: AC
  Filled 2018-05-13: qty 2

## 2018-05-13 MED ORDER — POVIDONE-IODINE 5 % OP SOLN
OPHTHALMIC | Status: AC
  Filled 2018-05-13: qty 30

## 2018-05-13 MED ORDER — POVIDONE-IODINE 5 % OP SOLN
OPHTHALMIC | Status: DC | PRN
  Administered 2018-05-13: 1 via OPHTHALMIC

## 2018-05-13 MED ORDER — TETRACAINE HCL 0.5 % OP SOLN
OPHTHALMIC | Status: AC
  Administered 2018-05-13: 1 [drp] via OPHTHALMIC
  Filled 2018-05-13: qty 4

## 2018-05-13 MED ORDER — EPINEPHRINE PF 1 MG/ML IJ SOLN
INTRAMUSCULAR | Status: AC
  Filled 2018-05-13: qty 1

## 2018-05-13 MED ORDER — CARBACHOL 0.01 % IO SOLN
INTRAOCULAR | Status: DC | PRN
  Administered 2018-05-13: .5 mL via INTRAOCULAR

## 2018-05-13 MED ORDER — ARMC OPHTHALMIC DILATING DROPS
OPHTHALMIC | Status: AC
  Administered 2018-05-13: 1 via OPHTHALMIC
  Filled 2018-05-13: qty 0.5

## 2018-05-13 MED ORDER — MIDAZOLAM HCL 2 MG/2ML IJ SOLN
INTRAMUSCULAR | Status: DC | PRN
  Administered 2018-05-13: 1 mg via INTRAVENOUS

## 2018-05-13 MED ORDER — SODIUM CHLORIDE 0.9 % IV SOLN
INTRAVENOUS | Status: DC
  Administered 2018-05-13: 09:00:00 via INTRAVENOUS

## 2018-05-13 MED ORDER — FENTANYL CITRATE (PF) 100 MCG/2ML IJ SOLN
INTRAMUSCULAR | Status: DC | PRN
  Administered 2018-05-13: 50 ug via INTRAVENOUS

## 2018-05-13 MED ORDER — TETRACAINE HCL 0.5 % OP SOLN
1.0000 [drp] | OPHTHALMIC | Status: AC | PRN
  Administered 2018-05-13 (×3): 1 [drp] via OPHTHALMIC

## 2018-05-13 MED ORDER — MOXIFLOXACIN HCL 0.5 % OP SOLN
OPHTHALMIC | Status: DC | PRN
  Administered 2018-05-13: .2 mL via OPHTHALMIC

## 2018-05-13 MED ORDER — MOXIFLOXACIN HCL 0.5 % OP SOLN: 1.0000 [drp] | OPHTHALMIC | Status: DC | PRN

## 2018-05-13 MED ORDER — MOXIFLOXACIN HCL 0.5 % OP SOLN
OPHTHALMIC | Status: AC
  Filled 2018-05-13: qty 3

## 2018-05-13 MED ORDER — LIDOCAINE HCL (PF) 4 % IJ SOLN
INTRAOCULAR | Status: DC | PRN
  Administered 2018-05-13: 2 mL via OPHTHALMIC

## 2018-05-13 MED ORDER — ARMC OPHTHALMIC DILATING DROPS
1.0000 "application " | OPHTHALMIC | Status: AC
  Administered 2018-05-13 (×3): 1 via OPHTHALMIC

## 2018-05-13 MED ORDER — FENTANYL CITRATE (PF) 100 MCG/2ML IJ SOLN
INTRAMUSCULAR | Status: AC
  Filled 2018-05-13: qty 2

## 2018-05-13 MED ORDER — NA CHONDROIT SULF-NA HYALURON 40-17 MG/ML IO SOLN
INTRAOCULAR | Status: DC | PRN
  Administered 2018-05-13: 1 mL via INTRAOCULAR

## 2018-05-13 SURGICAL SUPPLY — 16 items
GLOVE BIO SURGEON STRL SZ8 (GLOVE) ×2 IMPLANT
GLOVE BIOGEL M 6.5 STRL (GLOVE) ×2 IMPLANT
GLOVE SURG LX 8.0 MICRO (GLOVE) ×1
GLOVE SURG LX STRL 8.0 MICRO (GLOVE) ×1 IMPLANT
GOWN STRL REUS W/ TWL LRG LVL3 (GOWN DISPOSABLE) ×2 IMPLANT
GOWN STRL REUS W/TWL LRG LVL3 (GOWN DISPOSABLE) ×2
LABEL CATARACT MEDS ST (LABEL) ×2 IMPLANT
LENS IOL TECNIS ITEC 19.0 (Intraocular Lens) ×1 IMPLANT
PACK CATARACT (MISCELLANEOUS) ×2 IMPLANT
PACK CATARACT BRASINGTON LX (MISCELLANEOUS) ×2 IMPLANT
PACK EYE AFTER SURG (MISCELLANEOUS) ×2 IMPLANT
SOL BSS BAG (MISCELLANEOUS) ×2
SOLUTION BSS BAG (MISCELLANEOUS) ×1 IMPLANT
SYR 5ML LL (SYRINGE) ×2 IMPLANT
WATER STERILE IRR 250ML POUR (IV SOLUTION) ×2 IMPLANT
WIPE NON LINTING 3.25X3.25 (MISCELLANEOUS) ×2 IMPLANT

## 2018-05-13 NOTE — H&P (Signed)
All labs reviewed. Abnormal studies sent to patients PCP when indicated.  Previous H&P reviewed, patient examined, there are NO CHANGES.  Casey Ardelean Porfilio2/25/20209:07 AM

## 2018-05-13 NOTE — Anesthesia Preprocedure Evaluation (Signed)
Anesthesia Evaluation  Patient identified by MRN, date of birth, ID band Patient awake    Reviewed: Allergy & Precautions, NPO status , Patient's Chart, lab work & pertinent test results, reviewed documented beta blocker date and time   History of Anesthesia Complications (+) PONV and history of anesthetic complications  Airway Mallampati: III       Dental   Pulmonary former smoker,    Pulmonary exam normal        Cardiovascular hypertension, Normal cardiovascular exam+ Valvular Problems/Murmurs AS      Neuro/Psych  Neuromuscular disease negative psych ROS   GI/Hepatic Neg liver ROS, IBS   Endo/Other  negative endocrine ROS  Renal/GU negative Renal ROS  negative genitourinary   Musculoskeletal negative musculoskeletal ROS (+)   Abdominal Normal abdominal exam  (+)   Peds negative pediatric ROS (+)  Hematology negative hematology ROS (+)   Anesthesia Other Findings   Reproductive/Obstetrics                             Anesthesia Physical Anesthesia Plan  ASA: III  Anesthesia Plan: MAC   Post-op Pain Management:    Induction: Intravenous  PONV Risk Score and Plan:   Airway Management Planned: Nasal Cannula  Additional Equipment:   Intra-op Plan:   Post-operative Plan:   Informed Consent: I have reviewed the patients History and Physical, chart, labs and discussed the procedure including the risks, benefits and alternatives for the proposed anesthesia with the patient or authorized representative who has indicated his/her understanding and acceptance.     Dental advisory given  Plan Discussed with: CRNA and Surgeon  Anesthesia Plan Comments:         Anesthesia Quick Evaluation

## 2018-05-13 NOTE — Op Note (Signed)
PREOPERATIVE DIAGNOSIS:  Nuclear sclerotic cataract of the left eye.   POSTOPERATIVE DIAGNOSIS:  Nuclear sclerotic cataract of the left eye.   OPERATIVE PROCEDURE: Procedure(s): CATARACT EXTRACTION PHACO AND INTRAOCULAR LENS PLACEMENT (IOC) LEFT   SURGEON:  Galen Manila, MD.   ANESTHESIA:  Anesthesiologist: Yves Dill, MD CRNA: Danelle Berry, CRNA  1.      Managed anesthesia care. 2.     0.56ml of Shugarcaine was instilled following the paracentesis   COMPLICATIONS:  None.   TECHNIQUE:   Stop and chop   DESCRIPTION OF PROCEDURE:  The patient was examined and consented in the preoperative holding area where the aforementioned topical anesthesia was applied to the left eye and then brought back to the Operating Room where the left eye was prepped and draped in the usual sterile ophthalmic fashion and a lid speculum was placed. A paracentesis was created with the side port blade and the anterior chamber was filled with viscoelastic. A near clear corneal incision was performed with the steel keratome. A continuous curvilinear capsulorrhexis was performed with a cystotome followed by the capsulorrhexis forceps. Hydrodissection and hydrodelineation were carried out with BSS on a blunt cannula. The lens was removed in a stop and chop  technique and the remaining cortical material was removed with the irrigation-aspiration handpiece. The capsular bag was inflated with viscoelastic and the Technis [id number] lens was placed in the capsular bag without complication. The remaining viscoelastic was removed from the eye with the irrigation-aspiration handpiece. The wounds were hydrated. The anterior chamber was flushed with Miostat and the eye was inflated to physiologic pressure. 0.84ml Vigamox was placed in the anterior chamber. The wounds were found to be water tight. The eye was dressed with Vigamox. The patient was given protective glasses to wear throughout the day and a shield with which to sleep  tonight. The patient was also given drops with which to begin a drop regimen today and will follow-up with me in one day. Implant Name Type Inv. Item Serial No. Manufacturer Lot No. LRB No. Used  LENS IOL DIOP 19.0 - [id number] Intraocular Lens LENS IOL DIOP 19.0 [id number] AMO  Left 1    Procedure(s) with comments: CATARACT EXTRACTION PHACO AND INTRAOCULAR LENS PLACEMENT (IOC) LEFT (Left) - [place]  00:20 CDE 2.54 Fluid pack lot # [id number]  Electronically signed: Galen Manila 05/13/2018 9:33 AM

## 2018-05-13 NOTE — Transfer of Care (Signed)
Immediate Anesthesia Transfer of Care Note  Patient: Casey Michael  Procedure(s) Performed: CATARACT EXTRACTION PHACO AND INTRAOCULAR LENS PLACEMENT (IOC) LEFT (Left Eye)  Patient Location: PACU  Anesthesia Type:MAC  Level of Consciousness: awake, alert  and oriented  Airway & Oxygen Therapy: Patient Spontanous Breathing  Post-op Assessment: Report given to RN and Post -op Vital signs reviewed and stable  Post vital signs: Reviewed and stable  Last Vitals:  Vitals Value Taken Time  BP    Temp    Pulse    Resp    SpO2      Last Pain:  Vitals:   05/13/18 0837  TempSrc: Oral  PainSc: 0-No pain         Complications: No apparent anesthesia complications

## 2018-05-13 NOTE — Discharge Instructions (Addendum)
Eye Surgery Discharge Instructions  Expect mild scratchy sensation or mild soreness. DO NOT RUB YOUR EYE!  The day of surgery:  Minimal physical activity, but bed rest is not required  No reading, computer work, or close hand work  No bending, lifting, or straining.  May watch TV  For 24 hours:  No driving, legal decisions, or alcoholic beverages  Safety precautions  Eat anything you prefer: It is better to start with liquids, then soup then solid foods.  Solar shield eyeglasses should be worn for comfort in the sunlight/patch while sleeping  Resume all regular medications including aspirin or Coumadin if these were discontinued prior to surgery. You may shower, bathe, shave, or wash your hair. Tylenol may be taken for mild discomfort. Follow Dr. Gerome Sam eye drop instruction sheet as reviewed.  Call your doctor if you experience significant pain, nausea, or vomiting, fever > 101 or other signs of infection. [phone] or [phone] Specific instructions:  Follow-up Information    Galen Manila, MD Follow up.   Specialty:  Ophthalmology Why:  05/14/18 @ 10:15 am Contact information: [address] [place] [address] [phone]

## 2018-05-13 NOTE — Anesthesia Postprocedure Evaluation (Signed)
Anesthesia Post Note  Patient: Casey Michael  Procedure(s) Performed: CATARACT EXTRACTION PHACO AND INTRAOCULAR LENS PLACEMENT (IOC) LEFT (Left Eye)  Patient location during evaluation: Phase II Anesthesia Type: MAC Level of consciousness: awake, awake and alert and oriented Pain management: satisfactory to patient Vital Signs Assessment: post-procedure vital signs reviewed and stable Respiratory status: spontaneous breathing Cardiovascular status: blood pressure returned to baseline Anesthetic complications: no     Last Vitals:  Vitals:   05/13/18 0837  BP: (!) 167/79  Pulse: 65  Resp: 16  Temp: 36.6 C  SpO2: 100%    Last Pain:  Vitals:   05/13/18 0837  TempSrc: Oral  PainSc: 0-No pain                 Philbert Riser

## 2018-05-13 NOTE — Anesthesia Post-op Follow-up Note (Signed)
Anesthesia QCDR form completed.        

## 2018-05-14 NOTE — Progress Notes (Signed)
Pt reports several hours of vomiting after discharge from cataract surgery

## 2018-05-22 ENCOUNTER — Other Ambulatory Visit: Payer: Self-pay

## 2018-05-22 ENCOUNTER — Ambulatory Visit
Admission: EM | Admit: 2018-05-22 | Discharge: 2018-05-22 | Disposition: A | Discharge status: Home / Self Care | Payer: Medicare Other | Attending: Family Medicine | Admitting: Family Medicine

## 2018-05-22 DIAGNOSIS — Z87891 Personal history of nicotine dependence: Secondary | ICD-10-CM

## 2018-05-22 DIAGNOSIS — J069 Acute upper respiratory infection, unspecified: Secondary | ICD-10-CM | POA: Diagnosis not present

## 2018-05-22 MED ORDER — ALBUTEROL SULFATE 108 (90 BASE) MCG/ACT IN AEPB: 2.0000 | INHALATION_SPRAY | Freq: Four times a day (QID) | RESPIRATORY_TRACT | 0 refills | Status: AC | PRN

## 2018-05-22 MED ORDER — AZITHROMYCIN 250 MG PO TABS: 250.0000 mg | ORAL_TABLET | Freq: Every day | ORAL | 0 refills | Status: DC

## 2018-05-22 MED ORDER — HYDROCOD POLST-CPM POLST ER 10-8 MG/5ML PO SUER: 2.5000 mL | Freq: Two times a day (BID) | ORAL | 0 refills | Status: DC

## 2018-05-22 MED ORDER — BENZONATATE 200 MG PO CAPS: ORAL_CAPSULE | ORAL | 0 refills | Status: DC

## 2018-05-22 NOTE — Discharge Instructions (Addendum)
Drink plenty of fluids.  Rest as much as possible.  Use Tylenol or Motrin for fever chills or body aches.  °

## 2018-05-22 NOTE — ED Provider Notes (Signed)
[facility]    CSN: [id number] Arrival date & time: 05/22/18  0805     History   Chief Complaint Chief Complaint  Patient presents with  . Cough    HPI Casey Michael is a 78 y.o. female.   HPI  78 year old female presents with 4-day history of cough congestion body aches and feverish feeling.  She received her flu shot in September or October.  States that she went to church on Sunday and and later in the day just gradually increased with her feelings of the above symptoms.  Her cough has been productive of green sputum and congestion centered in the middle of her chest.  She states that it hurts to cough.  Nighttime is by far the worst.  Has been is complained that she has been rattling at night.      Past Medical History:  Diagnosis Date  . Aortic stenosis   . Complication of anesthesia   . Constipation   . Environmental and seasonal allergies   . Fracture of foot   . Heart murmur   . High cholesterol   . HOH (hard of hearing)    AIDS  . Hypertension   . IBS (irritable bowel syndrome)   . Neuropathy    RIGHT FOOT  . PONV (postoperative nausea and vomiting)     There are no active problems to display for this patient.   Past Surgical History:  Procedure Laterality Date  . ABDOMINAL HYSTERECTOMY    . CATARACT EXTRACTION W/PHACO Left 05/13/2018   Procedure: CATARACT EXTRACTION PHACO AND INTRAOCULAR LENS PLACEMENT (IOC) LEFT;  Surgeon: Galen Manila, MD;  Location: [facility] ORS;  Service: Ophthalmology;  Laterality: Left;  [place]  00:20 CDE 2.54 Fluid pack lot # [id number]  . CHOLECYSTECTOMY    . TONSILLECTOMY      OB History   No obstetric history on file.      Home Medications    Prior to Admission medications   Medication Sig Start Date End Date Taking? Authorizing Provider  amLODipine-valsartan (EXFORGE) 5-160 MG tablet Take 0.5 tablets by mouth daily.  01/13/18  Yes [provider]  aspirin EC 81 MG tablet Take 81 mg by mouth  daily.   Yes [provider]  carvedilol (COREG) 25 MG tablet Take 25 mg by mouth 2 (two) times daily. 01/03/18  Yes [provider]  fluticasone (FLONASE) 50 MCG/ACT nasal spray Place 1 spray into both nostrils daily as needed for allergies.  11/09/13  Yes [provider]  gabapentin (NEURONTIN) 300 MG capsule Take 100 mg by mouth at bedtime.    Yes [provider]  Multiple Vitamin (MULTIVITAMIN WITH MINERALS) TABS tablet Take 1 tablet by mouth daily.   Yes [provider]  rosuvastatin (CRESTOR) 40 MG tablet Take 20 mg by mouth every Monday, Wednesday, and Friday.  03/07/15  Yes [provider]  traMADol (ULTRAM) 50 MG tablet Take 50 mg by mouth every 6 (six) hours as needed (PAIN).  04/10/17  Yes [provider]  azithromycin (ZITHROMAX) 250 MG tablet Take 1 tablet (250 mg total) by mouth daily. Take first 2 tablets together, then 1 every day until finished. 05/22/18   Lutricia Feil, PA-C  benzonatate (TESSALON) 200 MG capsule Take one cap TID PRN cough 05/22/18   Lutricia Feil, PA-C  chlorpheniramine-HYDROcodone [facility] ER) 10-8 MG/5ML SUER Take 2.5 mLs by mouth 2 (two) times daily. 05/22/18   Lutricia Feil, PA-C  Family History Family History  Problem Relation Age of Onset  . Alzheimer's disease Mother   . Hypertension Mother   . Hyperlipidemia Mother   . Dementia Father   . Stroke Father     Social History Social History   Tobacco Use  . Smoking status: Former Smoker    Last attempt to quit: 08/28/1991    Years since quitting: 26.7  . Smokeless tobacco: Never Used  Substance Use Topics  . Alcohol use: Not Currently  . Drug use: Never     Allergies   Niacin; Penicillins; Atorvastatin; Metronidazole; Simvastatin; and Colesevelam   Review of Systems Review of Systems  Constitutional: Positive for activity change, chills, fatigue and fever.  HENT: Positive for congestion, postnasal drip  and sore throat.   Respiratory: Positive for cough and shortness of breath.   All other systems reviewed and are negative.    Physical Exam Triage Vital Signs ED Triage Vitals  Enc Vitals Group     BP 05/22/18 0819 (!) 142/68     Pulse Rate 05/22/18 0819 64     Resp 05/22/18 0819 16     Temp 05/22/18 0819 98.4 F (36.9 C)     Temp Source 05/22/18 0819 Oral     SpO2 05/22/18 0819 97 %     Weight 05/22/18 0817 170 lb (77.1 kg)     Height 05/22/18 0817 5\' 6"  (1.676 m)     Head Circumference --      Peak Flow --      Pain Score 05/22/18 0817 3     Pain Loc --      Pain Edu? --      Excl. in GC? --    No data found.  Updated Vital Signs BP (!) 142/68 (BP Location: Left Arm)   Pulse 64   Temp 98.4 F (36.9 C) (Oral)   Resp 16   Ht 5\' 6"  (1.676 m)   Wt 170 lb (77.1 kg)   SpO2 97%   BMI 27.44 kg/m   Visual Acuity Right Eye Distance:   Left Eye Distance:   Bilateral Distance:    Right Eye Near:   Left Eye Near:    Bilateral Near:     Physical Exam Vitals signs and nursing note reviewed.  Constitutional:      General: She is not in acute distress.    Appearance: Normal appearance. She is not ill-appearing, toxic-appearing or diaphoretic.  HENT:     Head: Normocephalic.     Right Ear: External ear normal.     Left Ear: External ear normal.     Nose: Congestion present. No rhinorrhea.     Mouth/Throat:     Mouth: Mucous membranes are moist.     Pharynx: Oropharynx is clear. No oropharyngeal exudate or posterior oropharyngeal erythema.  Eyes:     General:        Right eye: No discharge.        Left eye: No discharge.     Conjunctiva/sclera: Conjunctivae normal.  Neck:     Musculoskeletal: Normal range of motion and neck supple.  Pulmonary:     Effort: Pulmonary effort is normal.     Breath sounds: Normal breath sounds.  Musculoskeletal: Normal range of motion.  Lymphadenopathy:     Cervical: No cervical adenopathy.  Skin:    General: Skin is warm and  dry.  Neurological:     General: No focal deficit present.     Mental Status: She is  alert and oriented to person, place, and time.  Psychiatric:        Mood and Affect: Mood normal.        Behavior: Behavior normal.        Thought Content: Thought content normal.        Judgment: Judgment normal.      UC Treatments / Results  Labs (all labs ordered are listed, but only abnormal results are displayed) Labs Reviewed - No data to display  EKG None  Radiology No results found.  Procedures Procedures (including critical care time)  Medications Ordered in UC Medications - No data to display  Initial Impression / Assessment and Plan / UC Course  I have reviewed the triage vital signs and the nursing notes.  Pertinent labs & imaging results that were available during my care of the patient were reviewed by me and considered in my medical decision making (see chart for details).   Patient has an upper respiratory infection.  We will treat this symptomatically.   Final Clinical Impressions(s) / UC Diagnoses   Final diagnoses:  Upper respiratory tract infection, unspecified type     Discharge Instructions     Drink plenty of fluids.  Rest as much as possible.  Use Tylenol or Motrin for fever chills or body aches.    ED Prescriptions    Medication Sig Dispense Auth. Provider   azithromycin (ZITHROMAX) 250 MG tablet Take 1 tablet (250 mg total) by mouth daily. Take first 2 tablets together, then 1 every day until finished. 6 tablet Lutricia Feil, PA-C   benzonatate (TESSALON) 200 MG capsule Take one cap TID PRN cough 30 capsule Lutricia Feil, PA-C   chlorpheniramine-HYDROcodone (TUSSIONEX PENNKINETIC ER) 10-8 MG/5ML SUER Take 2.5 mLs by mouth 2 (two) times daily. 60 mL Lutricia Feil, PA-C     Controlled Substance Prescriptions [place] Controlled Substance Registry consulted? Not Applicable   Lutricia Feil, PA-C 05/22/18 1902

## 2018-05-22 NOTE — ED Triage Notes (Signed)
Patient complains of cough, congestion, body aches and fever that started on Sunday.

## 2018-06-10 ENCOUNTER — Ambulatory Visit: Admit: 2018-06-10 | Payer: Medicare Other | Admitting: Ophthalmology

## 2018-06-10 SURGERY — PHACOEMULSIFICATION, CATARACT, WITH IOL INSERTION
Anesthesia: Choice | Laterality: Right

## 2018-08-12 ENCOUNTER — Other Ambulatory Visit: Payer: Self-pay

## 2018-08-12 ENCOUNTER — Encounter: Payer: Self-pay | Admitting: *Deleted

## 2018-08-13 ENCOUNTER — Encounter: Payer: Self-pay | Admitting: Anesthesiology

## 2018-08-14 NOTE — Discharge Instructions (Signed)

## 2018-08-15 ENCOUNTER — Other Ambulatory Visit: Payer: Self-pay

## 2018-08-15 ENCOUNTER — Other Ambulatory Visit
Admission: RE | Admit: 2018-08-15 | Discharge: 2018-08-15 | Disposition: A | Discharge status: Home / Self Care | Payer: Medicare Other | Source: Ambulatory Visit | Attending: Ophthalmology | Admitting: Ophthalmology

## 2018-08-15 DIAGNOSIS — Z1159 Encounter for screening for other viral diseases: Secondary | ICD-10-CM | POA: Insufficient documentation

## 2018-08-16 LAB — NOVEL CORONAVIRUS, NAA (HOSP ORDER, SEND-OUT TO REF LAB; TAT 18-24 HRS): SARS-CoV-2, NAA: NOT DETECTED

## 2018-08-19 ENCOUNTER — Other Ambulatory Visit: Payer: Self-pay

## 2018-08-19 ENCOUNTER — Ambulatory Visit
Admission: RE | Admit: 2018-08-19 | Discharge: 2018-08-19 | Disposition: A | Discharge status: Home / Self Care | Payer: Medicare Other | Attending: Ophthalmology | Admitting: Ophthalmology

## 2018-08-19 ENCOUNTER — Encounter
Admission: RE | Disposition: A | Discharge status: Home / Self Care | Payer: Self-pay | Source: Home / Self Care | Attending: Ophthalmology

## 2018-08-19 DIAGNOSIS — I350 Nonrheumatic aortic (valve) stenosis: Secondary | ICD-10-CM | POA: Diagnosis not present

## 2018-08-19 DIAGNOSIS — H2511 Age-related nuclear cataract, right eye: Secondary | ICD-10-CM | POA: Insufficient documentation

## 2018-08-19 DIAGNOSIS — Z79899 Other long term (current) drug therapy: Secondary | ICD-10-CM | POA: Diagnosis not present

## 2018-08-19 DIAGNOSIS — R011 Cardiac murmur, unspecified: Secondary | ICD-10-CM | POA: Diagnosis not present

## 2018-08-19 DIAGNOSIS — K589 Irritable bowel syndrome without diarrhea: Secondary | ICD-10-CM | POA: Insufficient documentation

## 2018-08-19 DIAGNOSIS — I10 Essential (primary) hypertension: Secondary | ICD-10-CM | POA: Insufficient documentation

## 2018-08-19 DIAGNOSIS — Z7982 Long term (current) use of aspirin: Secondary | ICD-10-CM | POA: Diagnosis not present

## 2018-08-19 DIAGNOSIS — E7800 Pure hypercholesterolemia, unspecified: Secondary | ICD-10-CM | POA: Diagnosis not present

## 2018-08-19 DIAGNOSIS — G629 Polyneuropathy, unspecified: Secondary | ICD-10-CM | POA: Insufficient documentation

## 2018-08-19 HISTORY — PX: CATARACT EXTRACTION W/PHACO: SHX586

## 2018-08-19 SURGERY — PHACOEMULSIFICATION, CATARACT, WITH IOL INSERTION
Anesthesia: Choice | Site: Eye | Laterality: Right

## 2018-08-19 MED ORDER — MOXIFLOXACIN HCL 0.5 % OP SOLN
OPHTHALMIC | Status: DC | PRN
  Administered 2018-08-19: 0.2 mL via OPHTHALMIC

## 2018-08-19 MED ORDER — TETRACAINE HCL 0.5 % OP SOLN
1.0000 [drp] | OPHTHALMIC | Status: DC | PRN
  Administered 2018-08-19 (×2): 1 [drp] via OPHTHALMIC

## 2018-08-19 MED ORDER — LACTATED RINGERS IV SOLN: 10.0000 mL/h | INTRAVENOUS | Status: DC

## 2018-08-19 MED ORDER — EPINEPHRINE PF 1 MG/ML IJ SOLN
INTRAOCULAR | Status: DC | PRN
  Administered 2018-08-19: 79 mL via OPHTHALMIC

## 2018-08-19 MED ORDER — LIDOCAINE HCL (PF) 2 % IJ SOLN
INTRAOCULAR | Status: DC | PRN
  Administered 2018-08-19: 2 mL

## 2018-08-19 MED ORDER — ARMC OPHTHALMIC DILATING DROPS
1.0000 "application " | OPHTHALMIC | Status: DC | PRN
  Administered 2018-08-19 (×3): 1 via OPHTHALMIC

## 2018-08-19 MED ORDER — NA CHONDROIT SULF-NA HYALURON 40-17 MG/ML IO SOLN
INTRAOCULAR | Status: DC | PRN
  Administered 2018-08-19: 1 mL via INTRAOCULAR

## 2018-08-19 MED ORDER — BRIMONIDINE TARTRATE-TIMOLOL 0.2-0.5 % OP SOLN
OPHTHALMIC | Status: DC | PRN
  Administered 2018-08-19: 1 [drp] via OPHTHALMIC

## 2018-08-19 SURGICAL SUPPLY — 19 items
CANNULA ANT/CHMB 27GA (MISCELLANEOUS) ×3 IMPLANT
GLOVE SURG LX 8.0 MICRO (GLOVE) ×2
GLOVE SURG LX STRL 8.0 MICRO (GLOVE) ×1 IMPLANT
GLOVE SURG TRIUMPH 8.0 PF LTX (GLOVE) ×3 IMPLANT
GOWN STRL REUS W/ TWL LRG LVL3 (GOWN DISPOSABLE) ×2 IMPLANT
GOWN STRL REUS W/TWL LRG LVL3 (GOWN DISPOSABLE) ×4
LENS IOL TECNIS ITEC 19.5 (Intraocular Lens) ×3 IMPLANT
MARKER SKIN DUAL TIP RULER LAB (MISCELLANEOUS) ×3 IMPLANT
NDL RETROBULBAR .5 NSTRL (NEEDLE) ×3 IMPLANT
NEEDLE FILTER BLUNT 18X 1/2SAF (NEEDLE) ×2
NEEDLE FILTER BLUNT 18X1 1/2 (NEEDLE) ×1 IMPLANT
PACK CATARACT BRASINGTON (MISCELLANEOUS) ×3 IMPLANT
PACK EYE AFTER SURG (MISCELLANEOUS) ×3 IMPLANT
PACK OPTHALMIC (MISCELLANEOUS) ×3 IMPLANT
SOL BAL SALT 15ML (MISCELLANEOUS) ×3
SOLUTION BAL SALT 15ML (MISCELLANEOUS) ×1 IMPLANT
SYR 3ML LL SCALE MARK (SYRINGE) ×3 IMPLANT
SYR TB 1ML LUER SLIP (SYRINGE) ×3 IMPLANT
WIPE NON LINTING 3.25X3.25 (MISCELLANEOUS) ×3 IMPLANT

## 2018-08-19 NOTE — OR Nursing (Signed)
LOCAL PROCEDURE  IN ROOM @ 1141  HR--64 O2 SAT--100 BP--144/70  1146  HE--67 O2 SAT--97 BP--184/77  1150  HR--65 O2 SAT--97 BP--177/77  1155  HR--66 O2 SAT--98 BP--169/76  END OF PROCEDURE  HR--66 O2 SAT--99 BP--180/70

## 2018-08-19 NOTE — Op Note (Signed)
PREOPERATIVE DIAGNOSIS:  Nuclear sclerotic cataract of the right eye.   POSTOPERATIVE DIAGNOSIS:  CATARACT   OPERATIVE PROCEDURE: Procedure(s): CATARACT EXTRACTION PHACO AND INTRAOCULAR LENS PLACEMENT (IOC)  RIGHT   SURGEON:  Galen Manila, MD.   ANESTHESIA:  Anesthesiologist: Aldine Contes, MD  1.      Managed anesthesia care. 2.      0.81ml of Shugarcaine was instilled in the eye following the paracentesis.   COMPLICATIONS:  None.   TECHNIQUE:   Stop and chop   DESCRIPTION OF PROCEDURE:  The patient was examined and consented in the preoperative holding area where the aforementioned topical anesthesia was applied to the right eye and then brought back to the Operating Room where the right eye was prepped and draped in the usual sterile ophthalmic fashion and a lid speculum was placed. A paracentesis was created with the side port blade and the anterior chamber was filled with viscoelastic. A near clear corneal incision was performed with the steel keratome. A continuous curvilinear capsulorrhexis was performed with a cystotome followed by the capsulorrhexis forceps. Hydrodissection and hydrodelineation were carried out with BSS on a blunt cannula. The lens was removed in a stop and chop  technique and the remaining cortical material was removed with the irrigation-aspiration handpiece. The capsular bag was inflated with viscoelastic and the Technis [id number]  lens was placed in the capsular bag without complication. The remaining viscoelastic was removed from the eye with the irrigation-aspiration handpiece. The wounds were hydrated. The anterior chamber was flushed with BSS and the eye was inflated to physiologic pressure. 0.81ml of Vigamox was placed in the anterior chamber. The wounds were found to be water tight. The eye was dressed with Combigan. The patient was given protective glasses to wear throughout the day and a shield with which to sleep tonight. The patient was also given  drops with which to begin a drop regimen today and will follow-up with me in one day. Implant Name Type Inv. Item Serial No. Manufacturer Lot No. LRB No. Used  LENS IOL DIOP 19.5 - [id number] Intraocular Lens LENS IOL DIOP 19.5 [id number]  Right 1   Procedure(s) with comments: CATARACT EXTRACTION PHACO AND INTRAOCULAR LENS PLACEMENT (IOC)  RIGHT (Right) - NO ANESTHESIA  Electronically signed: Galen Manila 08/19/2018 12:01 PM

## 2018-08-19 NOTE — H&P (Signed)
All labs reviewed. Abnormal studies sent to patients PCP when indicated.  Previous H&P reviewed, patient examined, there are NO CHANGES.  Casey Mangels Porfilio6/2/202011:31 AM

## 2018-08-20 ENCOUNTER — Encounter: Payer: Self-pay | Admitting: Ophthalmology

## 2020-01-08 ENCOUNTER — Other Ambulatory Visit: Payer: Self-pay

## 2020-01-08 ENCOUNTER — Ambulatory Visit
Admission: EM | Admit: 2020-01-08 | Discharge: 2020-01-08 | Disposition: A | Discharge status: Home / Self Care | Payer: Medicare Other | Attending: Emergency Medicine | Admitting: Emergency Medicine

## 2020-01-08 DIAGNOSIS — K0889 Other specified disorders of teeth and supporting structures: Secondary | ICD-10-CM

## 2020-01-08 MED ORDER — TRAMADOL HCL 50 MG PO TABS: 50.0000 mg | ORAL_TABLET | Freq: Four times a day (QID) | ORAL | 0 refills | Status: AC | PRN

## 2020-01-08 MED ORDER — DOXYCYCLINE HYCLATE 100 MG PO CAPS: 100.0000 mg | ORAL_CAPSULE | Freq: Two times a day (BID) | ORAL | 0 refills | Status: AC

## 2020-01-08 MED ORDER — METHYLPREDNISOLONE 4 MG PO TBPK: ORAL_TABLET | ORAL | 0 refills | Status: AC

## 2020-01-08 NOTE — Discharge Instructions (Addendum)
Take the doxycycline twice a day with food. Start the Medrol Dosepak in the morning to help calm down the nerve pain. Use the tramadol as needed for severe pain. Follow-up with your dentist next week for further evaluation.

## 2020-01-08 NOTE — ED Provider Notes (Signed)
[facility]    CSN: [id number] Arrival date & time: 01/08/20  1807      History   Chief Complaint Chief Complaint  Patient presents with   Dental Pain    HPI Casey Michael is a 79 y.o. female.   79 year old female here for evaluation of left-sided dental pain.  She reports that her pain started 3 to 4 days ago.  Today she was eating lunch with a friend, but into a hamburger and the pain started to become excruciating.  She initially thought that she had a piece of food under her bridge and started rinsing with a medicated mouthwash and using her WaterPik.  She denies any fever, discharge from under her bridge, sensitivity to heat or cold, bad taste in her mouth, or changes to her hearing.  She says the pain is in her left upper teeth, lower teeth and in her left ear.  She has had some nasal congestion but no discharge.     Past Medical History:  Diagnosis Date   Aortic stenosis    Complication of anesthesia    Constipation    Environmental and seasonal allergies    Fracture of foot    Heart murmur    High cholesterol    HOH (hard of hearing)    AIDS   Hypertension    IBS (irritable bowel syndrome)    Neuropathy    RIGHT FOOT   PONV (postoperative nausea and vomiting)    severe - after 1st cataract    There are no problems to display for this patient.   Past Surgical History:  Procedure Laterality Date   ABDOMINAL HYSTERECTOMY     CATARACT EXTRACTION W/PHACO Left 05/13/2018   Procedure: CATARACT EXTRACTION PHACO AND INTRAOCULAR LENS PLACEMENT (IOC) LEFT;  Surgeon: Galen ManilaPorfilio, William, MD;  Location: [facility] ORS;  Service: Ophthalmology;  Laterality: Left;  [place]  00:20 CDE 2.54 Fluid pack lot # [id number]   CATARACT EXTRACTION W/PHACO Right 08/19/2018   Procedure: CATARACT EXTRACTION PHACO AND INTRAOCULAR LENS PLACEMENT (IOC)  RIGHT;  Surgeon: Galen ManilaPorfilio, William, MD;  [facility];  Service: Ophthalmology;  Laterality: Right;  NO  ANESTHESIA   CHOLECYSTECTOMY     TONSILLECTOMY      OB History   No obstetric history on file.      Home Medications    Prior to Admission medications   Medication Sig Start Date End Date Taking? Authorizing Provider  Albuterol Sulfate (PROAIR RESPICLICK) 108 (90 Base) MCG/ACT AEPB Inhale 2 puffs into the lungs 4 (four) times daily as needed. 05/22/18  Yes Ovid Curdoemer, William P, PA-C  amLODipine-valsartan (EXFORGE) 5-160 MG tablet Take 0.5 tablets by mouth daily.  01/13/18  Yes [provider]  aspirin EC 81 MG tablet Take 81 mg by mouth daily.   Yes [provider]  carvedilol (COREG) 25 MG tablet Take 25 mg by mouth 2 (two) times daily. 01/03/18  Yes [provider]  fluticasone (FLONASE) 50 MCG/ACT nasal spray Place 1 spray into both nostrils daily as needed for allergies.  11/09/13  Yes [provider]  Multiple Vitamin (MULTIVITAMIN WITH MINERALS) TABS tablet Take 1 tablet by mouth daily.   Yes [provider]  rosuvastatin (CRESTOR) 40 MG tablet Take 20 mg by mouth every Monday, Wednesday, and Friday.  03/07/15  Yes [provider]  doxycycline (VIBRAMYCIN) 100 MG capsule Take 1 capsule (100 mg total) by mouth 2 (two) times daily. 01/08/20   Becky Augustayan, Tyffany Waldrop, NP  methylPREDNISolone (  MEDROL DOSEPAK) 4 MG TBPK tablet Take according to the package insert. 01/08/20   Becky Augusta, NP  traMADol (ULTRAM) 50 MG tablet Take 1 tablet (50 mg total) by mouth every 6 (six) hours as needed. 01/08/20   Becky Augusta, NP    Family History Family History  Problem Relation Age of Onset   Alzheimer's disease Mother    Hypertension Mother    Hyperlipidemia Mother    Dementia Father    Stroke Father     Social History Social History   Tobacco Use   Smoking status: Former Smoker    Quit date: 08/28/1991    Years since quitting: 28.3   Smokeless tobacco: Never Used  Vaping Use   Vaping Use: Never used  Substance Use Topics   Alcohol  use: Not Currently   Drug use: Never     Allergies   Niacin, Penicillins, Atorvastatin, Metronidazole, Simvastatin, and Colesevelam   Review of Systems Review of Systems  Constitutional: Negative for activity change, appetite change and fever.  HENT: Positive for dental problem and ear pain. Negative for rhinorrhea and sore throat.   Respiratory: Negative for cough and shortness of breath.   Cardiovascular: Negative for chest pain.  Gastrointestinal: Negative for nausea and vomiting.  Genitourinary: Negative for dysuria and urgency.  Musculoskeletal: Negative for arthralgias, joint swelling and myalgias.  Skin: Negative for rash.  Neurological: Negative for dizziness, weakness and numbness.  Hematological: Negative.   Psychiatric/Behavioral: Negative.      Physical Exam Triage Vital Signs ED Triage Vitals  Enc Vitals Group     BP 01/08/20 1843 (!) 179/65     Pulse Rate 01/08/20 1843 (!) 59     Resp 01/08/20 1843 18     Temp 01/08/20 1843 98.2 F (36.8 C)     Temp Source 01/08/20 1843 Oral     SpO2 01/08/20 1843 97 %     Weight 01/08/20 1840 169 lb (76.7 kg)     Height 01/08/20 1840 5\' 5"  (1.651 m)     Head Circumference --      Peak Flow --      Pain Score 01/08/20 1839 5     Pain Loc --      Pain Edu? --      Excl. in GC? --    No data found.  Updated Vital Signs BP (!) 179/65 (BP Location: Right Arm)    Pulse (!) 59    Temp 98.2 F (36.8 C) (Oral)    Resp 18    Ht 5\' 5"  (1.651 m)    Wt 169 lb (76.7 kg)    SpO2 97%    BMI 28.12 kg/m   Visual Acuity Right Eye Distance:   Left Eye Distance:   Bilateral Distance:    Right Eye Near:   Left Eye Near:    Bilateral Near:     Physical Exam Vitals and nursing note reviewed.  Constitutional:      General: She is not in acute distress.    Appearance: Normal appearance. She is not ill-appearing.  HENT:     Head: Normocephalic and atraumatic.     Comments: Oral mucosa is pink and free of erythema or edema.   There is no pus discharging from underneath either her upper or lower bridge on the left side.  There is no bogginess to either the hard palate on the left or the sublingual area on the left.  No popping or clicking when opening and closing the  jaw.    Right Ear: Tympanic membrane, ear canal and external ear normal. There is no impacted cerumen.     Left Ear: Tympanic membrane, ear canal and external ear normal. There is no impacted cerumen.     Nose: Nose normal.     Mouth/Throat:     Mouth: Mucous membranes are dry.     Pharynx: Oropharynx is clear. No posterior oropharyngeal erythema.  Eyes:     General: No scleral icterus.    Extraocular Movements: Extraocular movements intact.     Conjunctiva/sclera: Conjunctivae normal.     Pupils: Pupils are equal, round, and reactive to light.  Pulmonary:     Effort: Pulmonary effort is normal.  Musculoskeletal:        General: Tenderness present. No swelling or signs of injury. Normal range of motion.     Cervical back: Normal range of motion and neck supple.     Comments: There is tenderness to the left TMJ.  Lymphadenopathy:     Cervical: No cervical adenopathy.  Skin:    General: Skin is warm and dry.     Capillary Refill: Capillary refill takes less than 2 seconds.     Findings: No erythema or rash.  Neurological:     General: No focal deficit present.     Mental Status: She is alert and oriented to person, place, and time.  Psychiatric:        Mood and Affect: Mood normal.        Behavior: Behavior normal.        Thought Content: Thought content normal.        Judgment: Judgment normal.      UC Treatments / Results  Labs (all labs ordered are listed, but only abnormal results are displayed) Labs Reviewed - No data to display  EKG   Radiology No results found.  Procedures Procedures (including critical care time)  Medications Ordered in UC Medications - No data to display  Initial Impression / Assessment and Plan / UC  Course  I have reviewed the triage vital signs and the nursing notes.  Pertinent labs & imaging results that were available during my care of the patient were reviewed by me and considered in my medical decision making (see chart for details).   Patient is here for evaluation of left-sided facial pain in her upper and lower jaw and pain in her left ear that has been going on for 3 to 4 days.  She initially thought that it was retained food from underneath her bridge but has been using oral rinses and water picks and has not been able to extract any food.  There has been no discharge from underneath her bridge.  The mucosal tissues in the mouth are pink and moist without erythema, edema, or discharge.  The left TM and EAC are completely benign.  There is no swelling to the left TMJ though it is tender to palpation.  No popping or clicking of the TMJ when patient opens and closes her mouth.  Suspect that her pain is neuropathic in origin.  Will treat for neuropathic pain with Medrol Dosepak, cover for any potential dental infections with doxycycline, give some tramadol for severe pain, and have her follow-up with her dentist Monday.   Final Clinical Impressions(s) / UC Diagnoses   Final diagnoses:  Pain, dental     Discharge Instructions     Take the doxycycline twice a day with food. Start the Medrol Dosepak in the morning  to help calm down the nerve pain. Use the tramadol as needed for severe pain. Follow-up with your dentist next week for further evaluation.    ED Prescriptions    Medication Sig Dispense Auth. Provider   doxycycline (VIBRAMYCIN) 100 MG capsule Take 1 capsule (100 mg total) by mouth 2 (two) times daily. 20 capsule Becky Augusta, NP   methylPREDNISolone (MEDROL DOSEPAK) 4 MG TBPK tablet Take according to the package insert. 1 each Becky Augusta, NP   traMADol (ULTRAM) 50 MG tablet Take 1 tablet (50 mg total) by mouth every 6 (six) hours as needed. 15 tablet Becky Augusta, NP       I have reviewed the PDMP during this encounter.   Becky Augusta, NP 01/08/20 1921

## 2020-01-08 NOTE — ED Triage Notes (Signed)
Patient complains of upper left dental pain that started yesterday and worsened yesterday. States that movement seems to make things worse. States that the outside feels swollen as well. States that she was unsure of what to do since her dentist is closed.
